# Patient Record
Sex: Male | Born: 1975 | Race: White | Hispanic: No | Marital: Married | Smoking: Current some day smoker
Health system: Southern US, Community
[De-identification: ages and names within clinical notes are randomized; demographics above are authoritative.]

## PROBLEM LIST (undated history)

## (undated) DIAGNOSIS — F1020 Alcohol dependence, uncomplicated: Secondary | ICD-10-CM

## (undated) DIAGNOSIS — T7840XA Allergy, unspecified, initial encounter: Secondary | ICD-10-CM

## (undated) HISTORY — DX: Alcohol dependence, uncomplicated: F10.20

## (undated) HISTORY — DX: Allergy, unspecified, initial encounter: T78.40XA

## (undated) HISTORY — PX: NO PAST SURGERIES: SHX2092

---

## 2007-12-09 ENCOUNTER — Encounter: Admission: RE | Admit: 2007-12-09 | Discharge: 2007-12-09 | Payer: Self-pay | Admitting: General Surgery

## 2008-04-12 ENCOUNTER — Encounter: Admission: RE | Admit: 2008-04-12 | Discharge: 2008-05-31 | Payer: Self-pay | Admitting: Sports Medicine

## 2017-03-05 ENCOUNTER — Encounter: Payer: Self-pay | Admitting: Medical

## 2017-03-05 ENCOUNTER — Ambulatory Visit (INDEPENDENT_AMBULATORY_CARE_PROVIDER_SITE_OTHER): Payer: BLUE CROSS/BLUE SHIELD | Admitting: Medical

## 2017-03-05 VITALS — BP 110/73 | HR 74 | Temp 98.2°F | Resp 16 | Ht 72.0 in | Wt 147.4 lb

## 2017-03-05 DIAGNOSIS — R109 Unspecified abdominal pain: Secondary | ICD-10-CM

## 2017-03-05 DIAGNOSIS — R112 Nausea with vomiting, unspecified: Secondary | ICD-10-CM | POA: Diagnosis not present

## 2017-03-05 DIAGNOSIS — F17200 Nicotine dependence, unspecified, uncomplicated: Secondary | ICD-10-CM | POA: Diagnosis not present

## 2017-03-05 MED ORDER — ONDANSETRON 8 MG PO TBDP: 8.0000 mg | ORAL_TABLET | Freq: Three times a day (TID) | ORAL | 0 refills | Status: DC | PRN

## 2017-03-05 MED ORDER — BUPROPION HCL ER (SR) 150 MG PO TB12: ORAL_TABLET | ORAL | 1 refills | Status: DC

## 2017-03-05 NOTE — Patient Instructions (Addendum)
For recent mild upset stomach with nausea and vomiting, I will get a CMP, CBC, amylase and lipase.  Recommend bland diet and hydrate well. Avoid any greasy foods and no alcohol.  For nausea prescribing Zofran.  I'm also prescribing Wellbutrin to help the stop smoking. Recommend waiting about 7-10 days beginning the medicine. Explained how can stop abruptly or taper off of cigarettes.  Follow-up in 2 weeks or as needed. If you want you to schedule complete physical.

## 2017-03-05 NOTE — Progress Notes (Signed)
Subjective:    Patient ID: Nadene Rubinsravis Viereck, male    DOB: [birth year], 41 y.o.   MRN: [id number]  HPI  Pt in for first time.   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 2 cups coffee a day. Admits a lot of processed food.(but does not eat out). Married- 3 children.  Pt states yesterday at work he got acute nausea and vomiting. He had to leave work. Pt had no diarrhea. Pt states ate subway but did not get upset his stomach. No family members sick. Pt feels some better today but energy is less. Stomach upset. Pt drank alcohol late last week. About 7 beers 2 nights. But none since Saturday. Pt has not eaten anything today.  Pt has been thinking of stopping smoking.     Review of Systems  Constitutional: Negative for chills, fatigue and fever.  HENT: Negative for congestion, drooling, ear pain, nosebleeds, postnasal drip, rhinorrhea, sinus pressure, sore throat and tinnitus.   Respiratory: Negative for cough, chest tightness, shortness of breath and wheezing.   Cardiovascular: Negative for chest pain and palpitations.  Gastrointestinal: Positive for abdominal pain, nausea and vomiting. Negative for abdominal distention, anal bleeding, blood in stool, constipation and diarrhea.       States pains upset stomach  Genitourinary: Negative for dysuria, flank pain and frequency.  Musculoskeletal: Negative for back pain, myalgias and neck pain.  Skin: Negative for rash.  Neurological: Negative for dizziness, seizures, light-headedness and headaches.  Hematological: Negative for adenopathy. Does not bruise/bleed easily.  Psychiatric/Behavioral: Negative for behavioral problems and confusion.    Past Medical History:  Diagnosis Date  . Allergy    seasonal.     Social History   Social History  . Marital status: Married    Spouse name: N/A  . Number of children: N/A  . Years of education: N/A   Occupational History  . Not on file.   Social History Main Topics  .  Smoking status: Current Some Day Smoker    Packs/day: 1.00    Years: 22.00  . Smokeless tobacco: Never Used     Comment: did counsel.  . Alcohol use Yes     Comment: 1-2 beers a nigh.  . Drug use: No  . Sexual activity: Yes   Other Topics Concern  . Not on file   Social History Narrative  . No narrative on file    History reviewed. No pertinent surgical history.  Family History  Problem Relation Age of Onset  . Cancer Mother     Allergies  Allergen Reactions  . Soma [Carisoprodol]     No current outpatient prescriptions on file prior to visit.   No current facility-administered medications on file prior to visit.     BP 110/73   Pulse 74   Temp 98.2 F (36.8 C) (Oral)   Resp 16   Ht 6' (1.829 m)   Wt 147 lb 6.4 oz (66.9 kg)   SpO2 99%   BMI 19.99 kg/m       Objective:   Physical Exam  General Mental Status- Alert. General Appearance- Not in acute distress.   Skin General: Color- Normal Color. Moisture- Normal Moisture.  Neck Carotid Arteries- Normal color. Moisture- Normal Moisture. No carotid bruits. No JVD.  Chest and Lung Exam Auscultation: Breath Sounds:-Normal.  Cardiovascular Auscultation:Rythm- Regular. Murmurs & Other Heart Sounds:Auscultation of the heart reveals- No Murmurs.  Abdomen Inspection:-Inspeection Normal. Palpation/Percussion:Note:No mass. Palpation and Percussion of the abdomen reveal- Non  Tender, Non Distended + BS, no rebound or guarding.    Neurologic Cranial Nerve exam:- CN III-XII intact(No nystagmus), symmetric smile. Strength:- 5/5 equal and symmetric strength both upper and lower extremities.      Assessment & Plan:  For recent mild upset stomach with nausea and vomiting, I will get a CMP, CBC, amylase and lipase.  Recommend bland diet and hydrate well. Avoid any greasy foods and no alcohol.  For nausea prescribing Zofran.  I'm also prescribing Wellbutrin to help the stop smoking. Recommend waiting  about 7-10 days beginning the medicine. Explained how can stop abruptly or taper off of cigarettes.  Follow-up in 2 weeks or as needed. If you want you to schedule complete physical.  Lavera Vandermeer, Ramon DredgeEdward, PA-C

## 2017-03-06 LAB — LIPASE: Lipase: 43 U/L (ref 11.0–59.0)

## 2017-03-06 LAB — CBC WITH DIFFERENTIAL/PLATELET
Basophils Absolute: 0.1 10*3/uL (ref 0.0–0.1)
Basophils Relative: 0.9 % (ref 0.0–3.0)
Eosinophils Absolute: 0.5 10*3/uL (ref 0.0–0.7)
Eosinophils Relative: 5.2 % — ABNORMAL HIGH (ref 0.0–5.0)
HCT: 46.9 % (ref 39.0–52.0)
Hemoglobin: 15.8 g/dL (ref 13.0–17.0)
Lymphocytes Relative: 25.2 % (ref 12.0–46.0)
Lymphs Abs: 2.5 10*3/uL (ref 0.7–4.0)
MCHC: 33.6 g/dL (ref 30.0–36.0)
MCV: 96 fl (ref 78.0–100.0)
Monocytes Absolute: 0.8 10*3/uL (ref 0.1–1.0)
Monocytes Relative: 8.4 % (ref 3.0–12.0)
Neutro Abs: 5.9 10*3/uL (ref 1.4–7.7)
Neutrophils Relative %: 60.3 % (ref 43.0–77.0)
Platelets: 300 10*3/uL (ref 150.0–400.0)
RBC: 4.88 Mil/uL (ref 4.22–5.81)
RDW: 13.5 % (ref 11.5–15.5)
WBC: 9.8 10*3/uL (ref 4.0–10.5)

## 2017-03-06 LAB — COMPREHENSIVE METABOLIC PANEL
ALT: 19 U/L (ref 0–53)
AST: 20 U/L (ref 0–37)
Albumin: 4.7 g/dL (ref 3.5–5.2)
Alkaline Phosphatase: 38 U/L — ABNORMAL LOW (ref 39–117)
BUN: 10 mg/dL (ref 6–23)
CO2: 31 mEq/L (ref 19–32)
Calcium: 9.5 mg/dL (ref 8.4–10.5)
Chloride: 103 mEq/L (ref 96–112)
Creatinine, Ser: 0.95 mg/dL (ref 0.40–1.50)
GFR: 92.24 mL/min (ref >60.00)
Glucose, Bld: 87 mg/dL (ref 70–99)
Potassium: 4.1 mEq/L (ref 3.5–5.1)
Sodium: 141 mEq/L (ref 135–145)
Total Bilirubin: 0.5 mg/dL (ref 0.2–1.2)
Total Protein: 7.2 g/dL (ref 6.0–8.3)

## 2017-03-06 LAB — AMYLASE: Amylase: 33 U/L (ref 27–131)

## 2017-03-09 ENCOUNTER — Telehealth: Payer: Self-pay | Admitting: Medical

## 2017-03-09 NOTE — Telephone Encounter (Signed)
Pt returned call for results.

## 2017-03-11 NOTE — Telephone Encounter (Signed)
Tried to reach pt again today. Mailed letter.

## 2017-03-18 ENCOUNTER — Ambulatory Visit (INDEPENDENT_AMBULATORY_CARE_PROVIDER_SITE_OTHER): Payer: BLUE CROSS/BLUE SHIELD | Admitting: Medical

## 2017-03-18 ENCOUNTER — Encounter: Payer: Self-pay | Admitting: Medical

## 2017-03-18 VITALS — BP 139/79 | HR 82 | Temp 98.2°F | Resp 16 | Ht 72.0 in | Wt 146.6 lb

## 2017-03-18 DIAGNOSIS — Z113 Encounter for screening for infections with a predominantly sexual mode of transmission: Secondary | ICD-10-CM | POA: Diagnosis not present

## 2017-03-18 DIAGNOSIS — Z0000 Encounter for general adult medical examination without abnormal findings: Secondary | ICD-10-CM | POA: Diagnosis not present

## 2017-03-18 DIAGNOSIS — Z23 Encounter for immunization: Secondary | ICD-10-CM | POA: Diagnosis not present

## 2017-03-18 LAB — COMPLETE METABOLIC PANEL WITH GFR
ALT: 17 U/L (ref 9–46)
AST: 20 U/L (ref 10–40)
Albumin: 4.6 g/dL (ref 3.6–5.1)
Alkaline Phosphatase: 44 U/L (ref 40–115)
BUN: 11 mg/dL (ref 7–25)
CO2: 23 mmol/L (ref 20–32)
Calcium: 9.9 mg/dL (ref 8.6–10.3)
Chloride: 103 mmol/L (ref 98–110)
Creat: 1 mg/dL (ref 0.60–1.35)
GFR, Est African American: >89 mL/min (ref >=60)
GFR, Est Non African American: >89 mL/min (ref >=60)
Glucose, Bld: 88 mg/dL (ref 65–99)
Potassium: 4.4 mmol/L (ref 3.5–5.3)
Sodium: 138 mmol/L (ref 135–146)
Total Bilirubin: 0.8 mg/dL (ref 0.2–1.2)
Total Protein: 6.9 g/dL (ref 6.1–8.1)

## 2017-03-18 LAB — POC URINALSYSI DIPSTICK (AUTOMATED)
Bilirubin, UA: NEGATIVE
Blood, UA: NEGATIVE
Glucose, UA: NEGATIVE
Ketones, UA: NEGATIVE
Leukocytes, UA: NEGATIVE
Nitrite, UA: NEGATIVE
Protein, UA: NEGATIVE
Spec Grav, UA: 1.02 (ref 1.010–1.025)
Urobilinogen, UA: 0.2 E.U./dL
pH, UA: 6 (ref 5.0–8.0)

## 2017-03-18 LAB — CBC WITH DIFFERENTIAL/PLATELET
Basophils Absolute: 0.1 10*3/uL (ref 0.0–0.1)
Basophils Relative: 1.3 % (ref 0.0–3.0)
Eosinophils Absolute: 0.3 10*3/uL (ref 0.0–0.7)
Eosinophils Relative: 4.4 % (ref 0.0–5.0)
HCT: 46.7 % (ref 39.0–52.0)
Hemoglobin: 15.7 g/dL (ref 13.0–17.0)
Lymphocytes Relative: 27.6 % (ref 12.0–46.0)
Lymphs Abs: 1.8 10*3/uL (ref 0.7–4.0)
MCHC: 33.7 g/dL (ref 30.0–36.0)
MCV: 95.5 fl (ref 78.0–100.0)
Monocytes Absolute: 0.5 10*3/uL (ref 0.1–1.0)
Monocytes Relative: 8.5 % (ref 3.0–12.0)
Neutro Abs: 3.8 10*3/uL (ref 1.4–7.7)
Neutrophils Relative %: 58.2 % (ref 43.0–77.0)
Platelets: 320 10*3/uL (ref 150.0–400.0)
RBC: 4.89 Mil/uL (ref 4.22–5.81)
RDW: 13 % (ref 11.5–15.5)
WBC: 6.4 10*3/uL (ref 4.0–10.5)

## 2017-03-18 LAB — LIPID PANEL
Cholesterol: 158 mg/dL (ref 0–200)
HDL: 63 mg/dL (ref >39.00)
LDL Cholesterol: 83 mg/dL (ref 0–99)
NonHDL: 95.44
Total CHOL/HDL Ratio: 3
Triglycerides: 61 mg/dL (ref 0.0–149.0)
VLDL: 12.2 mg/dL (ref 0.0–40.0)

## 2017-03-18 LAB — HIV ANTIBODY (ROUTINE TESTING W REFLEX): HIV 1&2 Ab, 4th Generation: NONREACTIVE

## 2017-03-18 NOTE — Progress Notes (Signed)
Subjective:    Patient ID: Ronald Berg, male    DOB: August 22, 1975, 41 y.o.   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 2 cups coffee a day. Admits a lot of processed food.(but does not eat out). Married- 3 children.  Pt states prior GI illness resolved. No diarrhea as before. Feels a lot better. Occasionally feels mild left lower quadrant region pain.  He is fasting.  Pt likely needs update tdap. Last one on record 1995.  Pt states he has been trying to stop smoking. I had written him Wellbutrin. Pt has tapered down some but he is still smoking some.     Review of Systems  Constitutional: Negative for chills, fatigue and fever.  HENT: Negative for congestion, ear discharge, facial swelling, hearing loss and postnasal drip.   Respiratory: Negative for cough, chest tightness, shortness of breath and wheezing.   Cardiovascular: Negative for palpitations.  Gastrointestinal: Negative for abdominal distention, abdominal pain, blood in stool, constipation, diarrhea, nausea and vomiting.       Most of symptoms resolved prior. Only faint transient mild in am. May last 10 minutes and then resolve.   Negative family history gi illness.    Genitourinary: Negative for decreased urine volume, difficulty urinating, enuresis, flank pain, frequency, penile pain, scrotal swelling and testicular pain.  Musculoskeletal: Negative for back pain and myalgias.  Skin: Negative for rash.  Neurological: Negative for dizziness, syncope, speech difficulty, weakness and headaches.  Hematological: Negative for adenopathy. Does not bruise/bleed easily.  Psychiatric/Behavioral: Negative for confusion, hallucinations, sleep disturbance and suicidal ideas. The patient is not nervous/anxious and is not hyperactive.    Past Medical History:  Diagnosis Date  . Allergy    seasonal.     Social History   Social History  . Marital status: Married    Spouse name: N/A    . Number of children: N/A  . Years of education: N/A   Occupational History  . Not on file.   Social History Main Topics  . Smoking status: Current Some Day Smoker    Packs/day: 1.00    Years: 22.00  . Smokeless tobacco: Never Used     Comment: did counsel.  . Alcohol use Yes     Comment: 1-2 beers a nigh.  . Drug use: No  . Sexual activity: Yes   Other Topics Concern  . Not on file   Social History Narrative  . No narrative on file    History reviewed. No pertinent surgical history.  Family History  Problem Relation Age of Onset  . Cancer Mother     Allergies  Allergen Reactions  . Soma [Carisoprodol]     Current Outpatient Prescriptions on File Prior to Visit  Medication Sig Dispense Refill  . buPROPion (WELLBUTRIN SR) 150 MG 12 hr tablet 1 tab po for 3 days then 1 tab po twice daily 63 tablet 1  . ondansetron (ZOFRAN ODT) 8 MG disintegrating tablet Take 1 tablet (8 mg total) by mouth every 8 (eight) hours as needed for nausea or vomiting. 20 tablet 0   No current facility-administered medications on file prior to visit.     BP 139/79   Pulse 82   Temp 98.2 F (36.8 C) (Oral)   Resp 16   Ht 6' (1.829 m)   Wt 146 lb 9.6 oz (66.5 kg)   SpO2 98%   BMI 19.88 kg/m       Objective:  Physical Exam   General  Mental Status - Alert. General Appearance - Well groomed. Not in acute distress.  Skin Rashes- No Rashes.  HEENT Head- Normal. Ear Auditory Canal - Left- Normal. Right - Normal.Tympanic Membrane- Left- Normal. Right- Normal. Eye Sclera/Conjunctiva- Left- Normal. Right- Normal. Nose & Sinuses Nasal Mucosa- Left-  Not  boggy and congested. Right-   Not boggy and  congested.Bilateral  No maxillary and no  frontal sinus pressure. Mouth & Throat Lips: Upper Lip- Normal: no dryness, cracking, pallor, cyanosis, or vesicular eruption. Lower Lip-Normal: no dryness, cracking, pallor, cyanosis or vesicular eruption. Buccal Mucosa- Bilateral- No  Aphthous ulcers. Oropharynx- No Discharge or Erythema. Tonsils: Characteristics- Bilateral- No Erythema or Congestion. Size/Enlargement- Bilateral- No enlargement. Discharge- bilateral-None.  Neck Neck- Supple. No Masses.   Chest and Lung Exam Auscultation: Breath Sounds:-Clear even and unlabored.  Cardiovascular Auscultation:Rythm- Regular, rate and rhythm. Murmurs & Other Heart Sounds:Ausculatation of the heart reveal- No Murmurs.  Lymphatic Head & Neck General Head & Neck Lymphatics: Bilateral: Description- No Localized lymphadenopathy.   Abdomen Inspection:-Inspection Normal.  Palpation/Perucssion: Palpation and Percussion of the abdomen reveal- Non Tender, No Rebound tenderness, No rigidity(Guarding) and No Palpable abdominal masses.  Liver:-Normal.  Spleen:- Normal.   Rectal Deferred.  Genital- deferred.  Neurologic Cranial Nerve exam:- CN III-XII intact(No nystagmus), symmetric smile. Strength:- 5/5 equal and symmetric strength both upper and lower extremities.      Assessment & Plan:  For you wellness exam today I have ordered cbc, cmp, lipid panel, ua and hiv.  Vaccine given today tdap.  Recommend exercise and healthy diet.  Continue smoking cessation.  We will let you know lab results as they come in.  Let me know if your mild transient left lower quadrant pain persists. The may be post GI illness related. But if worse, pesrists and does not taper off then my chart me. (in that event work up get hemoccult card and consider referral to GI. If indicated might consider flagyl and cipro. If symptoms worsen.  Follow up date appointment will be determined after lab review.   Flu vaccination can get in October as nurse visit.  Tacara Hadlock, Ramon Dredge, PA-C

## 2017-03-18 NOTE — Patient Instructions (Addendum)
For you wellness exam today I have ordered cbc, cmp, lipid panel, ua and hiv.  Vaccine given today tdap.  Recommend exercise and healthy diet.  Continue smoking cessation.  We will let you know lab results as they come in.  Let me know if your mild transient left lower quadrant pain persists. The may be post GI illness related. But if worse, pesrists and does not taper off then my chart me.(in that event work up get hemoccult card and consider referral to GI. If indicated might consider flagyl and cipro. If symptoms worsen.  Follow up date appointment will be determined after lab review.   Flu vaccination can get in October as nurse visit.    Preventive Care 40-64 Years, Male Preventive care refers to lifestyle choices and visits with your health care provider that can promote health and wellness. What does preventive care include?  A yearly physical exam. This is also called an annual well check.  Dental exams once or twice a year.  Routine eye exams. Ask your health care provider how often you should have your eyes checked.  Personal lifestyle choices, including: ? Daily care of your teeth and gums. ? Regular physical activity. ? Eating a healthy diet. ? Avoiding tobacco and drug use. ? Limiting alcohol use. ? Practicing safe sex. ? Taking low-dose aspirin every day starting at age 66. What happens during an annual well check? The services and screenings done by your health care provider during your annual well check will depend on your age, overall health, lifestyle risk factors, and family history of disease. Counseling Your health care provider may ask you questions about your:  Alcohol use.  Tobacco use.  Drug use.  Emotional well-being.  Home and relationship well-being.  Sexual activity.  Eating habits.  Work and work Statistician.  Screening You may have the following tests or measurements:  Height, weight, and BMI.  Blood pressure.  Lipid and  cholesterol levels. These may be checked every 5 years, or more frequently if you are over 51 years old.  Skin check.  Lung cancer screening. You may have this screening every year starting at age 49 if you have a 30-pack-year history of smoking and currently smoke or have quit within the past 15 years.  Fecal occult blood test (FOBT) of the stool. You may have this test every year starting at age 70.  Flexible sigmoidoscopy or colonoscopy. You may have a sigmoidoscopy every 5 years or a colonoscopy every 10 years starting at age 36.  Prostate cancer screening. Recommendations will vary depending on your family history and other risks.  Hepatitis C blood test.  Hepatitis B blood test.  Sexually transmitted disease (STD) testing.  Diabetes screening. This is done by checking your blood sugar (glucose) after you have not eaten for a while (fasting). You may have this done every 1-3 years.  Discuss your test results, treatment options, and if necessary, the need for more tests with your health care provider. Vaccines Your health care provider may recommend certain vaccines, such as:  Influenza vaccine. This is recommended every year.  Tetanus, diphtheria, and acellular pertussis (Tdap, Td) vaccine. You may need a Td booster every 10 years.  Varicella vaccine. You may need this if you have not been vaccinated.  Zoster vaccine. You may need this after age 29.  Measles, mumps, and rubella (MMR) vaccine. You may need at least one dose of MMR if you were born in 1957 or later. You may also need  a second dose.  Pneumococcal 13-valent conjugate (PCV13) vaccine. You may need this if you have certain conditions and have not been vaccinated.  Pneumococcal polysaccharide (PPSV23) vaccine. You may need one or two doses if you smoke cigarettes or if you have certain conditions.  Meningococcal vaccine. You may need this if you have certain conditions.  Hepatitis A vaccine. You may need this if  you have certain conditions or if you travel or work in places where you may be exposed to hepatitis A.  Hepatitis B vaccine. You may need this if you have certain conditions or if you travel or work in places where you may be exposed to hepatitis B.  Haemophilus influenzae type b (Hib) vaccine. You may need this if you have certain risk factors.  Talk to your health care provider about which screenings and vaccines you need and how often you need them. This information is not intended to replace advice given to you by your health care provider. Make sure you discuss any questions you have with your health care provider. Document Released: 08/24/2015 Document Revised: 04/16/2016 Document Reviewed: 05/29/2015 Elsevier Interactive Patient Education  2017 [employer].

## 2017-05-23 ENCOUNTER — Other Ambulatory Visit: Payer: Self-pay | Admitting: Medical

## 2017-11-04 ENCOUNTER — Encounter: Payer: Self-pay | Admitting: Medical

## 2017-11-04 ENCOUNTER — Ambulatory Visit (INDEPENDENT_AMBULATORY_CARE_PROVIDER_SITE_OTHER): Payer: BLUE CROSS/BLUE SHIELD | Admitting: Medical

## 2017-11-04 VITALS — BP 117/71 | HR 66 | Temp 98.0°F | Resp 16 | Ht 73.0 in | Wt 143.6 lb

## 2017-11-04 DIAGNOSIS — R1032 Left lower quadrant pain: Secondary | ICD-10-CM | POA: Diagnosis not present

## 2017-11-04 LAB — COMPREHENSIVE METABOLIC PANEL
ALT: 23 U/L (ref 0–53)
AST: 19 U/L (ref 0–37)
Albumin: 4.4 g/dL (ref 3.5–5.2)
Alkaline Phosphatase: 33 U/L — ABNORMAL LOW (ref 39–117)
BUN: 10 mg/dL (ref 6–23)
CO2: 30 mEq/L (ref 19–32)
Calcium: 9.7 mg/dL (ref 8.4–10.5)
Chloride: 104 mEq/L (ref 96–112)
Creatinine, Ser: 0.93 mg/dL (ref 0.40–1.50)
GFR: 94.24 mL/min (ref >60.00)
Glucose, Bld: 100 mg/dL — ABNORMAL HIGH (ref 70–99)
Potassium: 5.1 mEq/L (ref 3.5–5.1)
Sodium: 141 mEq/L (ref 135–145)
Total Bilirubin: 0.7 mg/dL (ref 0.2–1.2)
Total Protein: 6.9 g/dL (ref 6.0–8.3)

## 2017-11-04 LAB — CBC WITH DIFFERENTIAL/PLATELET
Basophils Absolute: 0 10*3/uL (ref 0.0–0.1)
Basophils Relative: 0.6 % (ref 0.0–3.0)
Eosinophils Absolute: 0.2 10*3/uL (ref 0.0–0.7)
Eosinophils Relative: 1.8 % (ref 0.0–5.0)
HCT: 45.3 % (ref 39.0–52.0)
Hemoglobin: 15.4 g/dL (ref 13.0–17.0)
Lymphocytes Relative: 24 % (ref 12.0–46.0)
Lymphs Abs: 2.1 10*3/uL (ref 0.7–4.0)
MCHC: 34.1 g/dL (ref 30.0–36.0)
MCV: 91.9 fl (ref 78.0–100.0)
Monocytes Absolute: 0.5 10*3/uL (ref 0.1–1.0)
Monocytes Relative: 5.8 % (ref 3.0–12.0)
Neutro Abs: 5.8 10*3/uL (ref 1.4–7.7)
Neutrophils Relative %: 67.8 % (ref 43.0–77.0)
Platelets: 284 10*3/uL (ref 150.0–400.0)
RBC: 4.93 Mil/uL (ref 4.22–5.81)
RDW: 13.3 % (ref 11.5–15.5)
WBC: 8.6 10*3/uL (ref 4.0–10.5)

## 2017-11-04 MED ORDER — METRONIDAZOLE 500 MG PO TABS: 500.0000 mg | ORAL_TABLET | Freq: Three times a day (TID) | ORAL | 0 refills | Status: DC

## 2017-11-04 MED ORDER — CIPROFLOXACIN HCL 500 MG PO TABS: 500.0000 mg | ORAL_TABLET | Freq: Two times a day (BID) | ORAL | 0 refills | Status: DC

## 2017-11-04 MED ORDER — ONDANSETRON 8 MG PO TBDP: 8.0000 mg | ORAL_TABLET | Freq: Three times a day (TID) | ORAL | 0 refills | Status: DC | PRN

## 2017-11-04 NOTE — Patient Instructions (Signed)
With your recent moderate to severe abdomen pain last night and left lower quadrant region pain presently, I am considering that you might have diverticulitis.  Your pain is on the lower and now but we need to proceed with caution.  Please get CBC, CMP and ifob stool cards.  Please do the stool card test and bring those back or mail back.  I want you to start Cipro and Flagyl antibiotics.  Eat healthy diet and stay hydrated.  If your pain does not gradually taper off then would consider getting CT of the abdomen/pelvis and possibly referring you to gastroenterologist as you have had some chronic pain over the past 4 months.  In the event of recurrent severe pain as described last night then recommend ED evaluation if that were to reoccur.  Also please update us if your location of abdomen pain changes.  Follow-up in 7 days or as needed.

## 2017-11-04 NOTE — Progress Notes (Signed)
Subjective:    Patient ID: Ronald Berg, male    DOB: July 17, 1976, 42 y.o.   MRN: [id number]  HPI  Pt in states had stomach pain on and off for a couple of years. Pt thought maybe alcohol related but he stopped drinking over past 4 months(he states pain still continues). Does drink about 4 cups of coffee. However stopped drinking coffee about one week.  Pt states last night woke up with sharp stabbing pain umbilicus toward left lower quadrant. He states pain was severe around 3:30 pm. He took zofran and within 10 minutes pain resolved. Pain is a lot less now. Pain level now is about 2/10.  Pt had little loose stools on and off. States regular basis has semi loose stools.  Over past 4 months he has been reporting some stomachs around mid stomach. Pain on and all day. More lower level.   Pt had no fevers, no chills and no sweats.  Pt did eat pizza last night. Store bought. No family members with same signs or symptoms.  No famiily history of crohns and ulcerative colitis.      Review of Systems  Constitutional: Negative for chills and fatigue.  Respiratory: Negative for cough, chest tightness, shortness of breath and wheezing.   Cardiovascular: Negative for chest pain and palpitations.  Gastrointestinal: Positive for abdominal pain and nausea. Negative for abdominal distention, blood in stool, diarrhea, rectal pain and vomiting.       Some dry heaves this morning but no vomiting.  Genitourinary: Negative for decreased urine volume, dysuria, frequency, genital sores and penile pain.  Musculoskeletal: Negative for back pain.  Skin: Negative for rash.  Neurological: Negative for facial asymmetry.  Hematological: Negative for adenopathy. Does not bruise/bleed easily.  Psychiatric/Behavioral: Negative for behavioral problems, confusion and self-injury.    Past Medical History:  Diagnosis Date  . Allergy    seasonal.     Social History   Socioeconomic History  . Marital  status: Married    Spouse name: Not on file  . Number of children: Not on file  . Years of education: Not on file  . Highest education level: Not on file  Occupational History  . Not on file  Social Needs  . Financial resource strain: Not on file  . Food insecurity:    Worry: Not on file    Inability: Not on file  . Transportation needs:    Medical: Not on file    Non-medical: Not on file  Tobacco Use  . Smoking status: Current Some Day Smoker    Packs/day: 1.00    Years: 22.00    Pack years: 22.00  . Smokeless tobacco: Never Used  . Tobacco comment: did counsel.  Substance and Sexual Activity  . Alcohol use: Yes    Comment: 1-2 beers a nigh.  . Drug use: No  . Sexual activity: Yes  Lifestyle  . Physical activity:    Days per week: Not on file    Minutes per session: Not on file  . Stress: Not on file  Relationships  . Social connections:    Talks on phone: Not on file    Gets together: Not on file    Attends religious service: Not on file    Active member of club or organization: Not on file    Attends meetings of clubs or organizations: Not on file    Relationship status: Not on file  . Intimate partner violence:    Fear of current or  ex partner: Not on file    Emotionally abused: Not on file    Physically abused: Not on file    Forced sexual activity: Not on file  Other Topics Concern  . Not on file  Social History Narrative  . Not on file    No past surgical history on file.  Family History  Problem Relation Age of Onset  . Cancer Mother     Allergies  Allergen Reactions  . Soma [Carisoprodol]     Current Outpatient Medications on File Prior to Visit  Medication Sig Dispense Refill  . buPROPion (WELLBUTRIN SR) 150 MG 12 hr tablet TAKE 1 TABLET BY MOUTH DAILY X 3 DAYS, THEN 1 TABLET TWICE DAILY 63 tablet 1  . ondansetron (ZOFRAN ODT) 8 MG disintegrating tablet Take 1 tablet (8 mg total) by mouth every 8 (eight) hours as needed for nausea or  vomiting. 20 tablet 0   No current facility-administered medications on file prior to visit.     BP 117/71   Pulse 66   Temp 98 F (36.7 C) (Oral)   Resp 16   Ht 6\' 1"  (1.854 m)   Wt 143 lb 9.6 oz (65.1 kg)   HC 6" (15.2 cm)   SpO2 100%   BMI 18.95 kg/m       Objective:   Physical Exam  General Appearance- Not in acute distress.  HEENT Eyes- Scleraeral/Conjuntiva-bilat- Not Yellow. Mouth & Throat- Normal.  Chest and Lung Exam Auscultation: Breath sounds:-Normal. Adventitious sounds:- No Adventitious sounds.  Cardiovascular Auscultation:Rythm - Regular. Heart Sounds -Normal heart sounds.  Abdomen Inspection:-Inspection Normal.  Palpation/Perucssion: Palpation and Percussion of the abdomen reveal-  Tender faint tenderness between umbilicus and llq. , Mild llq tender,  No Rebound tenderness, No rigidity(Guarding) and No Palpable abdominal masses.  Liver:-Normal.  Spleen:- Normal.   Genital exam- no obvious hernia on exam of either inguinal canal.(very high up on left side canal maybe defect but no obvious bulge).       Assessment & Plan:  With your recent moderate to severe abdomen pain last night and left lower quadrant region pain presently, I am considering that you might have diverticulitis.  Your pain is on the lower and now but we need to proceed with caution.  Please get CBC, CMP and ifob stool cards.  Please do the stool card test and bring those back or mail back.  I want you to start Cipro and Flagyl antibiotics.  Eat healthy diet and stay hydrated.  If your pain does not gradually taper off then would consider getting CT of the abdomen/pelvis and possibly referring you to gastroenterologist as you have had some chronic pain over the past 4 months.  In the event of recurrent severe pain as described last night then recommend ED evaluation if that were to reoccur.  Also please update [place] if your location of abdomen pain changes.  Follow-up in 7 days or  as needed.  Esperanza Richters, PA-C

## 2017-11-06 ENCOUNTER — Telehealth: Payer: Self-pay | Admitting: Medical

## 2017-11-06 NOTE — Telephone Encounter (Signed)
Copied from CRM [phone]. Topic: General - Other >> Nov 06, 2017  8:59 AM Maia Pettiesrtiz, Kristie S wrote: Reason for CRM: pt called stating he was stubborn in the office the other day and should have listened about being written out of work until Monday 11/09/17. He is requesting a new work note. Pt states ok to send note thru mychart.

## 2017-11-09 ENCOUNTER — Telehealth: Payer: Self-pay | Admitting: Medical

## 2017-11-09 NOTE — Telephone Encounter (Signed)
Would you clarify with patient when he wants to return to work.  Did he return to work today on the first.  I can write the note but I am not sure how to attach it to my chart.  Does he have a number that I could fax the letter to?  Let me know please

## 2017-11-09 NOTE — Telephone Encounter (Signed)
I tried to work note for patient last night.  Still cannot print work notes yet?  The note should read he can return to work on April 1.  I saw him last week.  If you could find that no and printed so I can sign it.  He wanted me to attach work note to my chart message.  I am not sure how to do that.  So if you could call him and get a fax number then we could fax the excuse.

## 2017-11-10 ENCOUNTER — Ambulatory Visit (INDEPENDENT_AMBULATORY_CARE_PROVIDER_SITE_OTHER): Payer: BLUE CROSS/BLUE SHIELD | Admitting: Medical

## 2017-11-10 ENCOUNTER — Ambulatory Visit (HOSPITAL_BASED_OUTPATIENT_CLINIC_OR_DEPARTMENT_OTHER)
Admission: RE | Admit: 2017-11-10 | Discharge: 2017-11-10 | Disposition: A | Discharge status: Home / Self Care | Payer: BLUE CROSS/BLUE SHIELD | Source: Ambulatory Visit | Attending: Medical | Admitting: Medical

## 2017-11-10 ENCOUNTER — Encounter: Payer: Self-pay | Admitting: Medical

## 2017-11-10 VITALS — BP 115/67 | HR 55 | Temp 97.9°F | Resp 16 | Ht 73.0 in | Wt 144.8 lb

## 2017-11-10 DIAGNOSIS — R1032 Left lower quadrant pain: Secondary | ICD-10-CM

## 2017-11-10 DIAGNOSIS — R112 Nausea with vomiting, unspecified: Secondary | ICD-10-CM | POA: Diagnosis not present

## 2017-11-10 DIAGNOSIS — K6389 Other specified diseases of intestine: Secondary | ICD-10-CM | POA: Insufficient documentation

## 2017-11-10 DIAGNOSIS — R195 Other fecal abnormalities: Secondary | ICD-10-CM | POA: Diagnosis not present

## 2017-11-10 LAB — COMPREHENSIVE METABOLIC PANEL
ALT: 32 U/L (ref 0–53)
AST: 27 U/L (ref 0–37)
Albumin: 4.3 g/dL (ref 3.5–5.2)
Alkaline Phosphatase: 33 U/L — ABNORMAL LOW (ref 39–117)
BUN: 11 mg/dL (ref 6–23)
CO2: 29 mEq/L (ref 19–32)
Calcium: 9.1 mg/dL (ref 8.4–10.5)
Chloride: 101 mEq/L (ref 96–112)
Creatinine, Ser: 0.94 mg/dL (ref 0.40–1.50)
GFR: 93.07 mL/min (ref >60.00)
Glucose, Bld: 90 mg/dL (ref 70–99)
Potassium: 4.2 mEq/L (ref 3.5–5.1)
Sodium: 136 mEq/L (ref 135–145)
Total Bilirubin: 0.8 mg/dL (ref 0.2–1.2)
Total Protein: 6.6 g/dL (ref 6.0–8.3)

## 2017-11-10 LAB — CBC WITH DIFFERENTIAL/PLATELET
Basophils Absolute: 0.1 10*3/uL (ref 0.0–0.1)
Basophils Relative: 0.6 % (ref 0.0–3.0)
Eosinophils Absolute: 0.2 10*3/uL (ref 0.0–0.7)
Eosinophils Relative: 2.2 % (ref 0.0–5.0)
HCT: 44.5 % (ref 39.0–52.0)
Hemoglobin: 15.1 g/dL (ref 13.0–17.0)
Lymphocytes Relative: 22.3 % (ref 12.0–46.0)
Lymphs Abs: 1.8 10*3/uL (ref 0.7–4.0)
MCHC: 34 g/dL (ref 30.0–36.0)
MCV: 90.9 fl (ref 78.0–100.0)
Monocytes Absolute: 0.6 10*3/uL (ref 0.1–1.0)
Monocytes Relative: 7.5 % (ref 3.0–12.0)
Neutro Abs: 5.4 10*3/uL (ref 1.4–7.7)
Neutrophils Relative %: 67.4 % (ref 43.0–77.0)
Platelets: 280 10*3/uL (ref 150.0–400.0)
RBC: 4.89 Mil/uL (ref 4.22–5.81)
RDW: 13.2 % (ref 11.5–15.5)
WBC: 8 10*3/uL (ref 4.0–10.5)

## 2017-11-10 LAB — AMYLASE: Amylase: 30 U/L (ref 27–131)

## 2017-11-10 LAB — LIPASE: Lipase: 18 U/L (ref 11.0–59.0)

## 2017-11-10 MED ORDER — IOPAMIDOL (ISOVUE-300) INJECTION 61%
100.0000 mL | Freq: Once | INTRAVENOUS | Status: AC | PRN
  Administered 2017-11-10: 100 mL via INTRAVENOUS

## 2017-11-10 NOTE — Telephone Encounter (Signed)
Pt has appointment today.  

## 2017-11-10 NOTE — Patient Instructions (Addendum)
For your left lower quadrant pain that is persisting, I want you to continue with antibiotics again for probable diverticulitis.  Since your  pain has recently worsened since last night, I do want to get a CT abdomen pelvis today.  Our staff is trying to get this test precertified.  Please go to the lab and do a CBC, CMP, amylase, lipase and H. pylori breath test.  Continue with Zofran for nausea or vomiting.   Work note/excuse written today.  If CT negative for diverticulitis then I do want you to return in stool panel kit.  Follow-up in 7-10 days or as needed.  Stool panel test ordered to turn in if diarrhea become predominant and ct negative.    

## 2017-11-10 NOTE — Telephone Encounter (Signed)
Found note and printed it.

## 2017-11-10 NOTE — Progress Notes (Signed)
Subjective:    Patient ID: Ronald Berg, male    DOB: Jun 18, 1976, 42 y.o.   MRN: [id number]  HPI    Pt has been having some nausea and vomiting recently. Pt states yesterday after smoking he had nausea and vomiting. He also states he ate salad last night before he vomiting.  Pt came in last week and he had left lower quadrant pain. Clinically I decided to treat with flagyl and cipro. Pt states pain in left lower quadrant was getting better but moderate pain this morning.  Pt has some fevers since I last saw him.  Chills and sweats last night.  Pt labs on last visit. Showed no elevated wbc and normal liver enzymes. Pt did mail the ifob cards on Saturday.  Pt states he felt good up until last night when he worsened again.    Review of Systems  Constitutional: Positive for chills, fatigue and fever.  Respiratory: Negative for cough, choking, shortness of breath and wheezing.   Cardiovascular: Negative for chest pain and palpitations.  Gastrointestinal: Positive for abdominal pain and diarrhea. Negative for abdominal distention, constipation, nausea and vomiting.       Pt is having some loose stools. Partly loose/partly formed.   Some have been mild watery at times but not consistent.  Musculoskeletal: Negative for back pain and myalgias.  Skin: Negative for rash.  Neurological: Negative for dizziness, numbness and headaches.  Hematological: Negative for adenopathy. Does not bruise/bleed easily.  Psychiatric/Behavioral: Negative for agitation and confusion.    Past Medical History:  Diagnosis Date  . Allergy    seasonal.     Social History   Socioeconomic History  . Marital status: Married    Spouse name: Not on file  . Number of children: Not on file  . Years of education: Not on file  . Highest education level: Not on file  Occupational History  . Not on file  Social Needs  . Financial resource strain: Not on file  . Food insecurity:    Worry: Not on file   Inability: Not on file  . Transportation needs:    Medical: Not on file    Non-medical: Not on file  Tobacco Use  . Smoking status: Current Some Day Smoker    Packs/day: 1.00    Years: 22.00    Pack years: 22.00  . Smokeless tobacco: Never Used  . Tobacco comment: did counsel.  Substance and Sexual Activity  . Alcohol use: Yes    Comment: 1-2 beers a nigh.  . Drug use: No  . Sexual activity: Yes  Lifestyle  . Physical activity:    Days per week: Not on file    Minutes per session: Not on file  . Stress: Not on file  Relationships  . Social connections:    Talks on phone: Not on file    Gets together: Not on file    Attends religious service: Not on file    Active member of club or organization: Not on file    Attends meetings of clubs or organizations: Not on file    Relationship status: Not on file  . Intimate partner violence:    Fear of current or ex partner: Not on file    Emotionally abused: Not on file    Physically abused: Not on file    Forced sexual activity: Not on file  Other Topics Concern  . Not on file  Social History Narrative  . Not on file  No past surgical history on file.  Family History  Problem Relation Age of Onset  . Cancer Mother     Allergies  Allergen Reactions  . Soma [Carisoprodol]     Current Outpatient Medications on File Prior to Visit  Medication Sig Dispense Refill  . buPROPion (WELLBUTRIN SR) 150 MG 12 hr tablet TAKE 1 TABLET BY MOUTH DAILY X 3 DAYS, THEN 1 TABLET TWICE DAILY 63 tablet 1  . ciprofloxacin (CIPRO) 500 MG tablet Take 1 tablet (500 mg total) by mouth 2 (two) times daily. 14 tablet 0  . metroNIDAZOLE (FLAGYL) 500 MG tablet Take 1 tablet (500 mg total) by mouth 3 (three) times daily. 21 tablet 0  . ondansetron (ZOFRAN ODT) 8 MG disintegrating tablet Take 1 tablet (8 mg total) by mouth every 8 (eight) hours as needed for nausea or vomiting. 20 tablet 0  . ondansetron (ZOFRAN ODT) 8 MG disintegrating tablet Take 1  tablet (8 mg total) by mouth every 8 (eight) hours as needed for nausea or vomiting. 20 tablet 0   No current facility-administered medications on file prior to visit.     BP 115/67   Pulse (!) 55   Temp 97.9 F (36.6 C) (Oral)   Resp 16   Ht 6' 1"  (1.854 m)   Wt 144 lb 12.8 oz (65.7 kg)   SpO2 100%   BMI 19.10 kg/m       Objective:   Physical Exam   General Mental Status- Alert. General Appearance- Not in acute distress.   Skin General: Color- Normal Color. Moisture- Normal Moisture.  Neck Carotid Arteries- Normal color. Moisture- Normal Moisture. No carotid bruits. No JVD.  Chest and Lung Exam Auscultation: Breath Sounds:-Normal.  Cardiovascular Auscultation:Rythm- Regular. Murmurs & Other Heart Sounds:Auscultation of the heart reveals- No Murmurs.  Abdomen Inspection:-Inspeection Normal. Palpation/Percussion:Note:No mass. Palpation and Percussion of the abdomen reveal- mid lower abdomen mild pain, umblical and left lower quadrant region pain, Non Distended + BS, no rebound or guarding.    Neurologic Cranial Nerve exam:- CN III-XII intact(No nystagmus), symmetric smile. Strength:- 5/5 equal and symmetric strength both upper and lower extremities.     Assessment & Plan:  For your left lower quadrant pain that is persisting, I want you to continue with antibiotics again for probable diverticulitis.  Since your  pain has recently worsened since last night, I do want to get a CT abdomen pelvis today.  Our staff is trying to get this test precertified.  Please go to the lab and do a CBC, CMP, amylase, lipase and H. pylori breath test.  Continue with Zofran for nausea or vomiting.   Work note/excuse written today.  If CT negative for diverticulitis then I do want you to return in stool panel kit.  Follow-up in 7-10 days or as needed.  Stool panel test ordered to turn in if diarrhea become predominant and ct negative.   Mackie Pai, PA-C

## 2017-11-11 ENCOUNTER — Encounter (HOSPITAL_BASED_OUTPATIENT_CLINIC_OR_DEPARTMENT_OTHER): Payer: Self-pay | Admitting: Emergency Medicine

## 2017-11-11 ENCOUNTER — Emergency Department (HOSPITAL_BASED_OUTPATIENT_CLINIC_OR_DEPARTMENT_OTHER)
Admission: EM | Admit: 2017-11-11 | Discharge: 2017-11-11 | Disposition: A | Discharge status: Home / Self Care | Payer: BLUE CROSS/BLUE SHIELD | Attending: Emergency Medicine | Admitting: Emergency Medicine

## 2017-11-11 ENCOUNTER — Ambulatory Visit: Payer: BLUE CROSS/BLUE SHIELD | Admitting: Medical

## 2017-11-11 ENCOUNTER — Encounter: Payer: Self-pay | Admitting: Medical

## 2017-11-11 ENCOUNTER — Other Ambulatory Visit: Payer: Self-pay

## 2017-11-11 ENCOUNTER — Emergency Department (HOSPITAL_BASED_OUTPATIENT_CLINIC_OR_DEPARTMENT_OTHER): Payer: BLUE CROSS/BLUE SHIELD

## 2017-11-11 ENCOUNTER — Ambulatory Visit (INDEPENDENT_AMBULATORY_CARE_PROVIDER_SITE_OTHER): Payer: BLUE CROSS/BLUE SHIELD | Admitting: Medical

## 2017-11-11 ENCOUNTER — Encounter: Payer: Self-pay | Admitting: Physician Assistant

## 2017-11-11 VITALS — BP 124/64 | HR 60 | Temp 98.0°F | Resp 16 | Ht 73.0 in | Wt 141.2 lb

## 2017-11-11 DIAGNOSIS — R1032 Left lower quadrant pain: Secondary | ICD-10-CM | POA: Diagnosis not present

## 2017-11-11 DIAGNOSIS — F17210 Nicotine dependence, cigarettes, uncomplicated: Secondary | ICD-10-CM | POA: Insufficient documentation

## 2017-11-11 DIAGNOSIS — R197 Diarrhea, unspecified: Secondary | ICD-10-CM | POA: Diagnosis not present

## 2017-11-11 DIAGNOSIS — Z79899 Other long term (current) drug therapy: Secondary | ICD-10-CM | POA: Diagnosis not present

## 2017-11-11 DIAGNOSIS — R1030 Lower abdominal pain, unspecified: Secondary | ICD-10-CM | POA: Diagnosis present

## 2017-11-11 DIAGNOSIS — R112 Nausea with vomiting, unspecified: Secondary | ICD-10-CM | POA: Diagnosis not present

## 2017-11-11 LAB — CBC WITH DIFFERENTIAL/PLATELET
Basophils Absolute: 0.1 10*3/uL (ref 0.0–0.1)
Basophils Relative: 1 %
Eosinophils Absolute: 0.3 10*3/uL (ref 0.0–0.7)
Eosinophils Relative: 3 %
HCT: 41.3 % (ref 39.0–52.0)
Hemoglobin: 14.5 g/dL (ref 13.0–17.0)
Lymphocytes Relative: 26 %
Lymphs Abs: 2.6 10*3/uL (ref 0.7–4.0)
MCH: 30.9 pg (ref 26.0–34.0)
MCHC: 35.1 g/dL (ref 30.0–36.0)
MCV: 88.1 fL (ref 78.0–100.0)
Monocytes Absolute: 1 10*3/uL (ref 0.1–1.0)
Monocytes Relative: 10 %
Neutro Abs: 6 10*3/uL (ref 1.7–7.7)
Neutrophils Relative %: 60 %
Platelets: 278 10*3/uL (ref 150–400)
RBC: 4.69 MIL/uL (ref 4.22–5.81)
RDW: 12.6 % (ref 11.5–15.5)
WBC: 9.8 10*3/uL (ref 4.0–10.5)

## 2017-11-11 LAB — COMPREHENSIVE METABOLIC PANEL
ALT: 35 U/L (ref 17–63)
AST: 34 U/L (ref 15–41)
Albumin: 4.3 g/dL (ref 3.5–5.0)
Alkaline Phosphatase: 30 U/L — ABNORMAL LOW (ref 38–126)
Anion gap: 11 (ref 5–15)
BUN: 9 mg/dL (ref 6–20)
CO2: 21 mmol/L — ABNORMAL LOW (ref 22–32)
Calcium: 8.7 mg/dL — ABNORMAL LOW (ref 8.9–10.3)
Chloride: 102 mmol/L (ref 101–111)
Creatinine, Ser: 0.94 mg/dL (ref 0.61–1.24)
GFR calc Af Amer: >60 mL/min (ref >60)
GFR calc non Af Amer: >60 mL/min (ref >60)
Glucose, Bld: 79 mg/dL (ref 65–99)
Potassium: 3.5 mmol/L (ref 3.5–5.1)
Sodium: 134 mmol/L — ABNORMAL LOW (ref 135–145)
Total Bilirubin: 0.9 mg/dL (ref 0.3–1.2)
Total Protein: 6.8 g/dL (ref 6.5–8.1)

## 2017-11-11 LAB — URINALYSIS, ROUTINE W REFLEX MICROSCOPIC
Bilirubin Urine: NEGATIVE
Glucose, UA: NEGATIVE mg/dL
Hgb urine dipstick: NEGATIVE
Ketones, ur: 15 mg/dL — AB
Leukocytes, UA: NEGATIVE
Nitrite: NEGATIVE
Protein, ur: NEGATIVE mg/dL
Specific Gravity, Urine: <1.005 — ABNORMAL LOW (ref 1.005–1.030)
pH: 7 (ref 5.0–8.0)

## 2017-11-11 LAB — LIPASE, BLOOD: Lipase: 30 U/L (ref 11–51)

## 2017-11-11 LAB — H. PYLORI BREATH TEST: H. pylori Breath Test: NOT DETECTED

## 2017-11-11 MED ORDER — METOCLOPRAMIDE HCL 5 MG/ML IJ SOLN
10.0000 mg | Freq: Once | INTRAMUSCULAR | Status: AC
  Administered 2017-11-11: 10 mg via INTRAVENOUS
  Filled 2017-11-11: qty 2

## 2017-11-11 MED ORDER — LOPERAMIDE HCL 2 MG PO CAPS
4.0000 mg | ORAL_CAPSULE | Freq: Once | ORAL | Status: AC
  Administered 2017-11-11: 4 mg via ORAL
  Filled 2017-11-11: qty 2

## 2017-11-11 MED ORDER — LOPERAMIDE HCL 2 MG PO CAPS: 2.0000 mg | ORAL_CAPSULE | Freq: Four times a day (QID) | ORAL | 0 refills | Status: DC | PRN

## 2017-11-11 MED ORDER — SODIUM CHLORIDE 0.9 % IV BOLUS
1000.0000 mL | Freq: Once | INTRAVENOUS | Status: AC
  Administered 2017-11-11: 1000 mL via INTRAVENOUS

## 2017-11-11 MED ORDER — METOCLOPRAMIDE HCL 10 MG PO TABS: 10.0000 mg | ORAL_TABLET | Freq: Four times a day (QID) | ORAL | 0 refills | Status: DC

## 2017-11-11 NOTE — Progress Notes (Signed)
Subjective:    Patient ID: Ronald Berg, male    DOB: [birth year], 42 y.o.   MRN: [id number]  HPI  Pt in for follow up.   He still has some severe left lower quadrant pain. Pt states zofran helps with nauseu but it is not adequate. Pt tried to eat 2 pieces of toast but could not significant food or fluid intake.  Pt did talk with GI office and appointment is December 01, 2017.   Pt feels weak. Wife states he looks like he is about to pass out ealier.  Patient admits that he is very tired.  CT summary showed yesterday Distal colon is incompletely a distended, with suboptimal assessment of colonic wall thickness; however no definite signs of acute diverticulitis or colonic inflammation are identified.  Probable tiny hiatal hernia.  Remainder of exam unremarkable.    Pt had large loose stools after constrast.   But this morning also loose stool but not as much.  Pt is approaching end of initial antibiotic course.   Pain level is 5/10. But last Wednesday was worse.    Review of Systems  Constitutional: Positive for fatigue. Negative for chills.  Respiratory: Negative for cough, chest tightness, shortness of breath and wheezing.   Cardiovascular: Negative for chest pain and palpitations.  Gastrointestinal: Positive for abdominal pain, diarrhea and nausea. Negative for abdominal distention, blood in stool and vomiting.       Dry heaving.  Genitourinary: Negative for decreased urine volume, difficulty urinating, discharge, frequency, scrotal swelling and testicular pain.  Musculoskeletal: Negative for back pain.  Skin: Negative for rash.  Neurological: Negative for dizziness, speech difficulty, weakness, light-headedness and numbness.  Hematological: Negative for adenopathy. Does not bruise/bleed easily.  Psychiatric/Behavioral: Negative for behavioral problems, decreased concentration, dysphoric mood and suicidal ideas. The patient is not nervous/anxious.     Past  Medical History:  Diagnosis Date  . Allergy    seasonal.     Social History   Socioeconomic History  . Marital status: Married    Spouse name: Not on file  . Number of children: Not on file  . Years of education: Not on file  . Highest education level: Not on file  Occupational History  . Not on file  Social Needs  . Financial resource strain: Not on file  . Food insecurity:    Worry: Not on file    Inability: Not on file  . Transportation needs:    Medical: Not on file    Non-medical: Not on file  Tobacco Use  . Smoking status: Current Some Day Smoker    Packs/day: 1.00    Years: 22.00    Pack years: 22.00  . Smokeless tobacco: Never Used  . Tobacco comment: did counsel.  Substance and Sexual Activity  . Alcohol use: Yes    Comment: 1-2 beers a nigh.  . Drug use: No  . Sexual activity: Yes  Lifestyle  . Physical activity:    Days per week: Not on file    Minutes per session: Not on file  . Stress: Not on file  Relationships  . Social connections:    Talks on phone: Not on file    Gets together: Not on file    Attends religious service: Not on file    Active member of club or organization: Not on file    Attends meetings of clubs or organizations: Not on file    Relationship status: Not on file  . Intimate partner violence:  Fear of current or ex partner: Not on file    Emotionally abused: Not on file    Physically abused: Not on file    Forced sexual activity: Not on file  Other Topics Concern  . Not on file  Social History Narrative  . Not on file    No past surgical history on file.  Family History  Problem Relation Age of Onset  . Cancer Mother     Allergies  Allergen Reactions  . Soma [Carisoprodol]     Current Outpatient Medications on File Prior to Visit  Medication Sig Dispense Refill  . buPROPion (WELLBUTRIN SR) 150 MG 12 hr tablet TAKE 1 TABLET BY MOUTH DAILY X 3 DAYS, THEN 1 TABLET TWICE DAILY 63 tablet 1  . ciprofloxacin (CIPRO)  500 MG tablet Take 1 tablet (500 mg total) by mouth 2 (two) times daily. 14 tablet 0  . metroNIDAZOLE (FLAGYL) 500 MG tablet Take 1 tablet (500 mg total) by mouth 3 (three) times daily. 21 tablet 0  . ondansetron (ZOFRAN ODT) 8 MG disintegrating tablet Take 1 tablet (8 mg total) by mouth every 8 (eight) hours as needed for nausea or vomiting. 20 tablet 0  . ondansetron (ZOFRAN ODT) 8 MG disintegrating tablet Take 1 tablet (8 mg total) by mouth every 8 (eight) hours as needed for nausea or vomiting. 20 tablet 0   No current facility-administered medications on file prior to visit.     BP 124/64   Pulse 60   Temp 98 F (36.7 C) (Oral)   Resp 16   Ht 6\' 1"  (1.854 m)   Wt 141 lb 3.2 oz (64 kg)   SpO2 100%   BMI 18.63 kg/m       Objective:   Physical Exam  General Appearance- Not in acute distress.  But does appeared fatigued/weak.  HEENT Eyes- Scleraeral/Conjuntiva-bilat- Not Yellow. Mouth & Throat- Normal.  Chest and Lung Exam Auscultation: Breath sounds:-Normal. Adventitious sounds:- No Adventitious sounds.  Cardiovascular Auscultation:Rythm - Regular. Heart Sounds -Normal heart sounds.  Abdomen Inspection:-Inspection Normal.  Palpation/Perucssion: Palpation and Percussion of the abdomen reveal-moderate supra pubic region tenderness and left lower quadrant tenderness, No Rebound tenderness, No rigidity(Guarding) and No Palpable abdominal masses.  Liver:-Normal.  Spleen:- Normal.   Back- no obvious CVA tenderness.      Assessment & Plan:  For your moderate to severe left lower quadrant pain, I want you to continue with oral antibiotics.  Since you are unable to tolerate food or liquids well presently and feels very weak, I do think it would be a good idea for you to go downstairs and get evaluated.  They would likely give you some IV hydration.  I notify them of your recent clinical presentation and suboptimal CT scan.  I did get your appointment with GI moved to  tomorrow at 10 AM.  Your appointment is with Gunnar Fusi.  Follow-up here after ED evaluation and GI appointment.

## 2017-11-11 NOTE — ED Notes (Signed)
Pt sipping water for fluid challenge.

## 2017-11-11 NOTE — ED Triage Notes (Signed)
LLQ abd pain with vomiting x 1 week. Started on abx by his PCP. Pt states symptoms persist.

## 2017-11-11 NOTE — Discharge Instructions (Addendum)
Please keep your appointment with gastroenterology tomorrow.   Take one tablet of loperamide every 6 hours as needed for diarrhea. Your first dose was given in the emergency department. Take one tablet of Reglan every 6 hours for nausea or vomiting. Stop taking the Zofran. Continue taking metronidazole and ciprofloxacin as prescribed.  If you feel like eating, following a bland diet (food choices are attached) may help to improve your symptoms.  It may take several days for the stool sample that was collected tonight to result in the lab. Your primary care of GI can follow up on these results with you.  If you develop new or worsening symptoms, including vomiting despite taking Reglan, blood in your diarrhea or vomit, uncontrollable abdominal pain, or if you stop having bowel movements or passing gas, please return to the Emergency Department for re-evaluation.

## 2017-11-11 NOTE — ED Provider Notes (Signed)
[facility] EMERGENCY DEPARTMENT Provider Note   CSN: [id number] Arrival date & time: 11/11/17  1557     History   Chief Complaint Chief Complaint  Patient presents with  . Abdominal Pain    HPI Ronald Berg is a 42 y.o. male who presents to the emergency department from his PCPs office with a chief complaint of abdominal pain, nausea, vomiting, and diarrhea.  Patient endorses bilateral lower abdominal pain, characterized as sharp, that began suddenly in the middle of the night 1 week ago.  Patient states the pain awoke him from sleep.  Since the onset of pain, pain has been intermittent, and is now located more locally in the suprapubic and left lower quadrant.  He now characterizes the pain as twisting.  He reports shortly after the onset of pain he developed chills, nausea, vomiting, and diarrhea.  He reports multiple episodes of NBNB vomiting and non-bloody diarrhea daily since onset of symptoms.  He also endorses anorexia.  His wife reports that he had a tactile fever 2 days ago, but they have not measured his temperature.  He also reports that he is now having bilateral lower back pain that radiates up to his mid back.  Last episode of emesis was last night, but he has had multiple episodes of dry heaving today.  He has not eaten today; last meal was yesterday afternoon when he had a salad.  No recent travel outside of the country or camping.  No notes sick contacts.  He was seen by his PCP just prior to arrival in the ED and on 11/04/2017 after symptoms began.  CT scan yesterday was inconclusive.  He was started on metronidazole, Flagyl, and Zofran with no improvement in his symptoms on 3/27.  No history of abdominal surgery.  His wife reports that he used to be a rather heavy drinker, but has not had any alcohol intake since December 2018.  He reports that he was a daily marijuana smoker up until the onset of symptoms.  He has an appointment with GI tomorrow.   The  history is provided by the patient. No language interpreter was used.  Abdominal Pain   Associated symptoms include diarrhea, nausea and vomiting. Pertinent negatives include fever, constipation, dysuria, hematuria and headaches.    Past Medical History:  Diagnosis Date  . Allergy    seasonal.    There are no active problems to display for this patient.   History reviewed. No pertinent surgical history.     Home Medications    Prior to Admission medications   Medication Sig Start Date End Date Taking? Authorizing Provider  buPROPion (WELLBUTRIN SR) 150 MG 12 hr tablet TAKE 1 TABLET BY MOUTH DAILY X 3 DAYS, THEN 1 TABLET TWICE DAILY 05/25/17   Saguier, Ramon Dredge, PA-C  ciprofloxacin (CIPRO) 500 MG tablet Take 1 tablet (500 mg total) by mouth 2 (two) times daily. 11/04/17   Saguier, Ramon Dredge, PA-C  loperamide (IMODIUM) 2 MG capsule Take 1 capsule (2 mg total) by mouth 4 (four) times daily as needed for diarrhea or loose stools. 11/11/17   McDonald, Mia A, PA-C  metoCLOPramide (REGLAN) 10 MG tablet Take 1 tablet (10 mg total) by mouth every 6 (six) hours. 11/11/17   McDonald, Mia A, PA-C  metroNIDAZOLE (FLAGYL) 500 MG tablet Take 1 tablet (500 mg total) by mouth 3 (three) times daily. 11/04/17   Saguier, Ramon Dredge, PA-C  ondansetron (ZOFRAN ODT) 8 MG disintegrating tablet Take 1 tablet (8 mg total) by mouth  every 8 (eight) hours as needed for nausea or vomiting. 03/05/17   Saguier, Ramon DredgeEdward, PA-C  ondansetron (ZOFRAN ODT) 8 MG disintegrating tablet Take 1 tablet (8 mg total) by mouth every 8 (eight) hours as needed for nausea or vomiting. 11/04/17   Saguier, Ramon DredgeEdward, PA-C    Family History Family History  Problem Relation Age of Onset  . Cancer Mother     Social History Social History   Tobacco Use  . Smoking status: Current Some Day Smoker    Packs/day: 1.00    Years: 22.00    Pack years: 22.00  . Smokeless tobacco: Never Used  . Tobacco comment: did counsel.  Substance Use Topics  .  Alcohol use: Yes    Comment: 1-2 beers a nigh.  . Drug use: No     Allergies   Soma [carisoprodol]   Review of Systems Review of Systems  Constitutional: Negative for appetite change and fever.  HENT: Negative for congestion and sinus pain.   Respiratory: Negative for shortness of breath.   Cardiovascular: Negative for chest pain.  Gastrointestinal: Positive for abdominal pain, diarrhea, nausea and vomiting. Negative for abdominal distention, anal bleeding, blood in stool, constipation and rectal pain.  Genitourinary: Negative for dysuria, hematuria, penile pain and penile swelling.  Musculoskeletal: Negative for back pain, neck pain and neck stiffness.  Skin: Negative for rash.  Allergic/Immunologic: Negative for immunocompromised state.  Neurological: Negative for dizziness, weakness, numbness and headaches.  Psychiatric/Behavioral: Negative for confusion.   Physical Exam Updated Vital Signs BP 123/60 (BP Location: Right Arm)   Pulse 62   Temp 98.3 F (36.8 C) (Oral)   Resp 20   SpO2 94%   Physical Exam  Constitutional: He appears well-developed. No distress.  Well appearing. Non-toxic.   HENT:  Head: Normocephalic.  Eyes: Conjunctivae are normal.  Neck: Neck supple.  Cardiovascular: Normal rate, regular rhythm, normal heart sounds and intact distal pulses. Exam reveals no gallop and no friction rub.  No murmur heard. Pulmonary/Chest: Effort normal and breath sounds normal. No stridor. No respiratory distress. He has no wheezes. He has no rales. He exhibits no tenderness.  Abdominal: Soft. Bowel sounds are normal. He exhibits no distension and no mass. There is tenderness. There is no rebound and no guarding. No hernia.  Minimally tender to palpation in the left lower quadrant.  No rebound or guarding.  The remainder of the abdominal exam is unremarkable.  No peritoneal signs.  No CVA tenderness bilaterally.  Musculoskeletal: Normal range of motion. He exhibits no  edema, tenderness or deformity.  Neurological: He is alert.  Skin: Skin is warm and dry. Capillary refill takes less than 2 seconds. No rash noted. He is not diaphoretic. No pallor.  Psychiatric: His behavior is normal.  Nursing note and vitals reviewed.  ED Treatments / Results  Labs (all labs ordered are listed, but only abnormal results are displayed) Labs Reviewed  URINALYSIS, ROUTINE W REFLEX MICROSCOPIC - Abnormal; Notable for the following components:      Result Value   Specific Gravity, Urine <1.005 (*)    Ketones, ur 15 (*)    All other components within normal limits  COMPREHENSIVE METABOLIC PANEL - Abnormal; Notable for the following components:   Sodium 134 (*)    CO2 21 (*)    Calcium 8.7 (*)    Alkaline Phosphatase 30 (*)    All other components within normal limits  GASTROINTESTINAL PANEL BY PCR, STOOL (REPLACES STOOL CULTURE)  CBC WITH DIFFERENTIAL/PLATELET  LIPASE, BLOOD    EKG EKG Interpretation  Date/Time:  Wednesday November 11 2017 17:11:47 EDT Ventricular Rate:  50 PR Interval:    QRS Duration: 112 QT Interval:  391 QTC Calculation: 357 R Axis:   89 Text Interpretation:  Sinus rhythm Borderline intraventricular conduction delay ST elev, probable normal early repol pattern No old tracing to compare Confirmed by Melene Plan [phone]) on 11/11/2017 5:19:45 PM   Radiology Ct Abdomen Pelvis Wo Contrast  Result Date: 11/11/2017 CLINICAL DATA:  42 y/o M; 1 week of sharp abdominal pain, nausea, and vomiting. EXAM: CT ABDOMEN AND PELVIS WITHOUT CONTRAST TECHNIQUE: Multidetector CT imaging of the abdomen and pelvis was performed following the standard protocol without IV contrast. COMPARISON:  11/10/2017 CT abdomen and pelvis. FINDINGS: Lower chest: No acute abnormality. Hepatobiliary: Stable subcentimeter cyst in right lobe of liver. Otherwise no focal liver abnormality is seen. No gallstones, gallbladder wall thickening, or biliary dilatation. Pancreas: Unremarkable.  No pancreatic ductal dilatation or surrounding inflammatory changes. Spleen: Normal in size without focal abnormality. Adrenals/Urinary Tract: Adrenal glands are unremarkable. Kidneys are normal, without renal calculi, focal lesion, or hydronephrosis. Bladder is unremarkable. Stomach/Bowel: Stomach is within normal limits. Appendix not identified, no pericecal inflammation. No evidence of bowel wall thickening, distention, or inflammatory changes. Vascular/Lymphatic: No significant vascular findings are present. No enlarged abdominal or pelvic lymph nodes. Reproductive: Prostate is unremarkable. Other: No abdominal wall hernia or abnormality. No abdominopelvic ascites. Musculoskeletal: No fracture is seen. IMPRESSION: No acute process identified. Stable negative CT of the abdomen and pelvis. Electronically Signed   By: Mitzi Hansen M.D.   On: 11/11/2017 19:00   Ct Abdomen Pelvis W Contrast  Result Date: 11/10/2017 CLINICAL DATA:  Abdominal pain, LEFT lower quadrant pain and tenderness for over week, nausea and vomiting for several days, question diverticulitis EXAM: CT ABDOMEN AND PELVIS WITH CONTRAST TECHNIQUE: Multidetector CT imaging of the abdomen and pelvis was performed using the standard protocol following bolus administration of intravenous contrast. Sagittal and coronal MPR images reconstructed from axial data set. CONTRAST:  ISOVUE-300 IOPAMIDOL (ISOVUE-300) INJECTION 61% IV. Dilute oral contrast. COMPARISON:  12/09/2007 FINDINGS: Lower chest: Lung bases appear minimally hyperinflated but clear Hepatobiliary: Tiny probable cyst RIGHT lobe liver 5 mm diameter. Gallbladder and liver otherwise normal appearance. Pancreas: Normal appearance Spleen: Normal appearance.  Small splenule at splenic hilum. Adrenals/Urinary Tract: Adrenal glands, kidneys, ureters, and bladder normal appearance. Stomach/Bowel: Normal appendix. Stomach decompressed with suspect tiny hiatal hernia. Small bowel loops  unremarkable. Oral contrast is present to the rectum. Rectosigmoid colon is incompletely distended, wall slightly prominent though inconsistent and this may be an artifact related to underdistention. No definite colonic inflammatory process is identified. No colonic diverticula noted. Vascular/Lymphatic: Vascular structures unremarkable. Aorta normal caliber. No adenopathy. Reproductive: Minimal prostatic enlargement. Other: No free air or free fluid. No hernia or acute inflammatory process seen. Musculoskeletal: Normal appearance IMPRESSION: Distal colon is incompletely a distended, with suboptimal assessment of colonic wall thickness; however no definite signs of acute diverticulitis or colonic inflammation are identified. Probable tiny hiatal hernia. Remainder of exam unremarkable. Electronically Signed   By: Ulyses Southward M.D.   On: 11/10/2017 12:36    Procedures Procedures (including critical care time)  Medications Ordered in ED Medications  metoCLOPramide (REGLAN) injection 10 mg (10 mg Intravenous Given 11/11/17 1737)  sodium chloride 0.9 % bolus 1,000 mL (0 mLs Intravenous Stopped 11/11/17 1924)  loperamide (IMODIUM) capsule 4 mg (4 mg Oral Given 11/11/17 1739)  Initial Impression / Assessment and Plan / ED Course  I have reviewed the triage vital signs and the nursing notes.  Pertinent labs & imaging results that were available during my care of the patient were reviewed by me and considered in my medical decision making (see chart for details).     42 year old male with abdominal pain, nausea, vomiting, and diarrhea.  CT scan yesterday demonstrated incomplete distention of the distal colon with suboptimal assessment of colonic wall thickness, but no definite signs of acute radiculitis or colitis.  He has been taking ciprofloxacin and metronidazole prescribed by his PCP since 11/04/2017.  Labs today are reassuring.  EKG with normal QTC despite recent antiemetic use.  Repeat CT with no acute  intra-abdominal pathology.  Reglan given for nausea.  He was given an IV fluid bolus.  He was successfully fluid challenged in the ED. Patient is nontoxic, nonseptic appearing, in no apparent distress.  Patient does not meet the SIRS or Sepsis criteria.  On repeat exam patient does not have a surgical abdomen and there are no peritoneal signs.  No indication of appendicitis, bowel obstruction, bowel perforation, cholecystitis, diverticulitis.  Patient discharged home with Reglan, loperamide, and given strict instructions for follow-up with their primary care physician.  Recommended continuation of home metronidazole and ciprofloxacin until the course is finished.  I have also discussed reasons to return immediately to the ER.  Patient expresses understanding and agrees with plan.  Final Clinical Impressions(s) / ED Diagnoses   Final diagnoses:  Nausea vomiting and diarrhea  Left lower quadrant pain    ED Discharge Orders        Ordered    loperamide (IMODIUM) 2 MG capsule  4 times daily PRN     11/11/17 1945    metoCLOPramide (REGLAN) 10 MG tablet  Every 6 hours     11/11/17 1945       McDonald, Mia A, PA-C 11/12/17 0013    Melene Plan, DO 11/12/17 1605

## 2017-11-11 NOTE — Patient Instructions (Addendum)
For your moderate to severe left lower quadrant pain, I want you to continue with oral antibiotics.  Since you are unable to tolerate food or liquids well presently and feels very weak, I do think it would be a good idea for you to go downstairs and get evaluated.  They would likely give you  IV hydration.  I notify them of your recent clinical presentation and suboptimal CT scan.  I did get your appointment with GI moved to tomorrow at 10 AM.  Your appointment is with Gunnar FusiPaula.  Follow-up here after ED evaluation and GI appointment.

## 2017-11-12 ENCOUNTER — Encounter: Payer: Self-pay | Admitting: Nurse Practitioner

## 2017-11-12 ENCOUNTER — Other Ambulatory Visit (INDEPENDENT_AMBULATORY_CARE_PROVIDER_SITE_OTHER): Payer: BLUE CROSS/BLUE SHIELD

## 2017-11-12 ENCOUNTER — Ambulatory Visit (INDEPENDENT_AMBULATORY_CARE_PROVIDER_SITE_OTHER): Payer: BLUE CROSS/BLUE SHIELD | Admitting: Nurse Practitioner

## 2017-11-12 VITALS — BP 108/70 | HR 64 | Ht 70.75 in | Wt 139.5 lb

## 2017-11-12 DIAGNOSIS — R197 Diarrhea, unspecified: Secondary | ICD-10-CM

## 2017-11-12 DIAGNOSIS — R112 Nausea with vomiting, unspecified: Secondary | ICD-10-CM

## 2017-11-12 DIAGNOSIS — R1032 Left lower quadrant pain: Secondary | ICD-10-CM | POA: Diagnosis not present

## 2017-11-12 LAB — GASTROINTESTINAL PANEL BY PCR, STOOL (REPLACES STOOL CULTURE)

## 2017-11-12 LAB — FECAL OCCULT BLOOD, IMMUNOCHEMICAL: Fecal Occult Bld: NEGATIVE

## 2017-11-12 MED ORDER — NA SULFATE-K SULFATE-MG SULF 17.5-3.13-1.6 GM/177ML PO SOLN: 1.0000 | Freq: Once | ORAL | 0 refills | Status: AC

## 2017-11-12 NOTE — Progress Notes (Signed)
Assessment and plan reviewed 

## 2017-11-12 NOTE — Progress Notes (Signed)
Chief Complaint: LLQ pain and diarrhea  Referring Provider:   Brayton Caves, PA-C   ASSESSMENT AND PLAN;   42 year old male with acute onset of left lower quadrant pain, nausea, vomiting, and diarrhea.  Labs and two CT scans basically unremarkable the rectosigmoid colon under distended on the first CT scan .  Patient had similar symptoms in July without diarrhea.  Episodes resolve spontaneously.  He is currently on antibiotics prescribed by PCP for presumed diverticulitis .  Today patient feels fine with no GI symptoms .  -Await stool studies collected in ED. recently stopped drinking alcohol, could be contributing to the diarrhea -Complete antibiotics prescribed by PCP -Patient should have a colonoscopic evaluation not so much for these acute symptoms but rather for his history of minor rectal bleeding.  The risks and benefits of colonoscopy with possible polypectomy and biopsies were discussed and the patient agrees to proceed.    HPI:    Patient is a 42 year old male referred by PCP . In July he developed acute LLQ pain and nausea. Pain was worse with eating. Started bland diet and pain resolved. He had a several month reprieve then last week woke up during night with severe LLQ pain, this time associated with vomiting and diarrhea. Saw PCP, labs unremarkable, started on Cipro / flagyl for possible diverticulitis.  Returned to PCP 4/2 for worsening symptoms.  Repeat labs normal.  CT scan unrevealing except for incomplete distention of rectosigmoid colon. Yesterday he felt terrible, went to ED. repeat labs were again normal.  Repeat CT scan done (non-contrast) and unremarkable.  Still on antibiotics, today patient feels fine . So far no diarrhea, nausea nor abdominal pain today.  No diarrhea today but previously having multiple loose stools a day since last week. Stools were non-bloody but he did see blood when wiping couple of months ago. His weight is stable.  Its onset of GI symptoms  late March patient has been following a bland diet  No other GI complaints.   Past Medical History:  Diagnosis Date  . Alcoholism (HCC)   . Allergy    seasonal.    Past Surgical History:  Procedure Laterality Date  . NO PAST SURGERIES     Family History  Problem Relation Age of Onset  . Melanoma Mother   . Diverticulitis Paternal Grandmother   . Parkinson's disease Paternal Grandfather   . Alzheimer's disease Paternal Grandfather    Social History   Tobacco Use  . Smoking status: Current Some Day Smoker    Packs/day: 1.00    Years: 22.00    Pack years: 22.00  . Smokeless tobacco: Never Used  . Tobacco comment: did counsel.  Substance Use Topics  . Alcohol use: Not Currently    Comment: quit 11/12018  . Drug use: No   Current Outpatient Medications  Medication Sig Dispense Refill  . buPROPion (WELLBUTRIN SR) 150 MG 12 hr tablet TAKE 1 TABLET BY MOUTH DAILY X 3 DAYS, THEN 1 TABLET TWICE DAILY 63 tablet 1  . ciprofloxacin (CIPRO) 500 MG tablet Take 1 tablet (500 mg total) by mouth 2 (two) times daily. 14 tablet 0  . loperamide (IMODIUM) 2 MG capsule Take 1 capsule (2 mg total) by mouth 4 (four) times daily as needed for diarrhea or loose stools. 12 capsule 0  . metoCLOPramide (REGLAN) 10 MG tablet Take 1 tablet (10 mg total) by mouth every 6 (six) hours. 30 tablet 0  . metroNIDAZOLE (FLAGYL) 500 MG tablet Take 1  tablet (500 mg total) by mouth 3 (three) times daily. 21 tablet 0  . ondansetron (ZOFRAN ODT) 8 MG disintegrating tablet Take 1 tablet (8 mg total) by mouth every 8 (eight) hours as needed for nausea or vomiting. 20 tablet 0   No current facility-administered medications for this visit.    Allergies  Allergen Reactions  . Soma [Carisoprodol]      Review of Systems: All systems reviewed and negative except where noted in HPI.    Physical Exam:    Wt Readings from Last 3 Encounters:  11/12/17 139 lb 8 oz (63.3 kg)  11/11/17 141 lb 3.2 oz (64 kg)    11/10/17 144 lb 12.8 oz (65.7 kg)    BP 108/70 (BP Location: Left Arm, Patient Position: Sitting, Cuff Size: Normal)   Pulse 64   Ht 5' 10.75" (1.797 m) Comment: height measured without shoes  Wt 139 lb 8 oz (63.3 kg)   BMI 19.59 kg/m  Constitutional:  Well-developed, white male in no acute distress. Psychiatric: Normal mood and affect. Behavior is normal. EENT: Pupils normal.  Conjunctivae are normal. No scleral icterus. Neck supple.  Cardiovascular: Normal rate, regular rhythm. No edema Pulmonary/chest: Effort normal and breath sounds normal. No wheezing, rales or rhonchi. Abdominal: Soft, nondistended. Nontender. Bowel sounds active throughout. There are no masses palpable. No hepatomegaly. Neurological: Alert and oriented to person place and time. Skin: Skin is warm and dry. No rashes noted.  Willette ClusterPaula Reegan Mctighe, NP  11/12/2017, 10:36 AM  Cc: Esperanza RichtersSaguier, Edward, PA-C

## 2017-11-12 NOTE — Patient Instructions (Addendum)
If you are age 43 or older, your body mass index should be between 23-30. Your Body mass index is 19.59 kg/m. If this is out of the aforementioned range listed, please consider follow up with your Primary Care Provider.  If you are age 43 or younger, your body mass index should be between 19-25. Your Body mass index is 19.59 kg/m. If this is out of the aformentioned range listed, please consider follow up with your Primary Care Provider.   You have been scheduled for a colonoscopy. Please follow written instructions given to you at your visit today.  Please pick up your prep supplies at the pharmacy within the next 1-3 days. If you use inhalers (even only as needed), please bring them with you on the day of your procedure. Your physician has requested that you go to www.startemmi.com and enter the access code given to you at your visit today. This web site gives a general overview about your procedure. However, you should still follow specific instructions given to you by our office regarding your preparation for the procedure.  We have sent the following medications to your pharmacy for you to pick up at your convenience: Suprep  Thank you for choosing [facility], Willette ClusterPaula Guenther, NP

## 2017-11-17 ENCOUNTER — Ambulatory Visit (HOSPITAL_BASED_OUTPATIENT_CLINIC_OR_DEPARTMENT_OTHER)
Admission: RE | Admit: 2017-11-17 | Discharge: 2017-11-17 | Disposition: A | Discharge status: Home / Self Care | Payer: BLUE CROSS/BLUE SHIELD | Source: Ambulatory Visit | Attending: Medical | Admitting: Medical

## 2017-11-17 ENCOUNTER — Ambulatory Visit (INDEPENDENT_AMBULATORY_CARE_PROVIDER_SITE_OTHER): Payer: BLUE CROSS/BLUE SHIELD | Admitting: Medical

## 2017-11-17 ENCOUNTER — Encounter: Payer: Self-pay | Admitting: Medical

## 2017-11-17 ENCOUNTER — Telehealth: Payer: Self-pay | Admitting: Medical

## 2017-11-17 VITALS — BP 128/84 | HR 87 | Temp 98.2°F | Resp 16 | Ht 73.0 in | Wt 137.8 lb

## 2017-11-17 DIAGNOSIS — R059 Cough, unspecified: Secondary | ICD-10-CM

## 2017-11-17 DIAGNOSIS — Z87891 Personal history of nicotine dependence: Secondary | ICD-10-CM

## 2017-11-17 DIAGNOSIS — R195 Other fecal abnormalities: Secondary | ICD-10-CM | POA: Diagnosis not present

## 2017-11-17 DIAGNOSIS — R61 Generalized hyperhidrosis: Secondary | ICD-10-CM

## 2017-11-17 DIAGNOSIS — R109 Unspecified abdominal pain: Secondary | ICD-10-CM

## 2017-11-17 DIAGNOSIS — R05 Cough: Secondary | ICD-10-CM | POA: Insufficient documentation

## 2017-11-17 LAB — CBC WITH DIFFERENTIAL/PLATELET
Basophils Absolute: 0.1 10*3/uL (ref 0.0–0.1)
Basophils Relative: 1 % (ref 0.0–3.0)
Eosinophils Absolute: 0.3 10*3/uL (ref 0.0–0.7)
Eosinophils Relative: 4.7 % (ref 0.0–5.0)
HCT: 44.4 % (ref 39.0–52.0)
Hemoglobin: 15.2 g/dL (ref 13.0–17.0)
Lymphocytes Relative: 29.1 % (ref 12.0–46.0)
Lymphs Abs: 1.6 10*3/uL (ref 0.7–4.0)
MCHC: 34.3 g/dL (ref 30.0–36.0)
MCV: 91 fl (ref 78.0–100.0)
Monocytes Absolute: 0.6 10*3/uL (ref 0.1–1.0)
Monocytes Relative: 10.4 % (ref 3.0–12.0)
Neutro Abs: 3 10*3/uL (ref 1.4–7.7)
Neutrophils Relative %: 54.8 % (ref 43.0–77.0)
Platelets: 296 10*3/uL (ref 150.0–400.0)
RBC: 4.88 Mil/uL (ref 4.22–5.81)
RDW: 13.4 % (ref 11.5–15.5)
WBC: 5.5 10*3/uL (ref 4.0–10.5)

## 2017-11-17 LAB — COMPREHENSIVE METABOLIC PANEL
ALT: 32 U/L (ref 0–53)
AST: 23 U/L (ref 0–37)
Albumin: 4.3 g/dL (ref 3.5–5.2)
Alkaline Phosphatase: 30 U/L — ABNORMAL LOW (ref 39–117)
BUN: 13 mg/dL (ref 6–23)
CO2: 31 mEq/L (ref 19–32)
Calcium: 9.3 mg/dL (ref 8.4–10.5)
Chloride: 102 mEq/L (ref 96–112)
Creatinine, Ser: 0.86 mg/dL (ref 0.40–1.50)
GFR: 103.12 mL/min (ref >60.00)
Glucose, Bld: 82 mg/dL (ref 70–99)
Potassium: 4.3 mEq/L (ref 3.5–5.1)
Sodium: 138 mEq/L (ref 135–145)
Total Bilirubin: 0.5 mg/dL (ref 0.2–1.2)
Total Protein: 6.7 g/dL (ref 6.0–8.3)

## 2017-11-17 MED ORDER — DICYCLOMINE HCL 10 MG PO CAPS: 10.0000 mg | ORAL_CAPSULE | Freq: Three times a day (TID) | ORAL | 0 refills | Status: DC

## 2017-11-17 MED ORDER — ONDANSETRON 4 MG PO TBDP: 4.0000 mg | ORAL_TABLET | Freq: Three times a day (TID) | ORAL | 0 refills | Status: DC | PRN

## 2017-11-17 NOTE — Telephone Encounter (Signed)
Copied from CRM [id number]. Topic: Quick Communication - See Telephone Encounter >> Nov 17, 2017 10:06 AM Valentina LucksMatos, Jackelin wrote: CRM for notification. See Telephone encounter for: 11/17/17.   Pt dropped off document to be filled out by provider (FMLA - 5 pages) Pt would like to be called when document ready to pick up at [phone]. Document put at front office tray under provider.

## 2017-11-17 NOTE — Progress Notes (Signed)
Subjective:    Patient ID: Ronald Berg, male    DOB: [birth year], 42 y.o.   MRN: [id number]  HPI  Pt in states he still has diarrhea about 5 loose stools today. Pt nausea is a lot less.  The ifob panel was negative. The GI panel was negative.  Pt did go to the ED the other day as I recommended. He eventually also made appointment with GI.Marland Kitchen.  Pt has colonoscocpy on May 2019. GI did rx reglan and pt tried immodium.  Pt has some intermittent abdomen cramps. Some suprapubic pain and left lower quadrant pain this am. Pt has been sticking to bland diet. Pt accidentally ate red and bell pepper rice and symptoms flared again.  Pt does have hx of smoking. Less smoking recently since sick with the above.    Review of Systems  Constitutional: Negative for chills, fatigue and fever.       Some occasional night sweats.  Respiratory: Positive for cough. Negative for chest tightness, shortness of breath and wheezing.        Rare  Cardiovascular: Negative for chest pain and palpitations.  Gastrointestinal: Positive for abdominal pain, diarrhea and nausea. Negative for abdominal distention, blood in stool, constipation and vomiting.       Nausea is less.  Musculoskeletal: Negative for back pain and gait problem.  Skin: Negative for rash.  Neurological: Negative for dizziness, syncope, speech difficulty, numbness and headaches.  Hematological: Negative for adenopathy. Does not bruise/bleed easily.  Psychiatric/Behavioral: Negative for behavioral problems, confusion and dysphoric mood. The patient is not nervous/anxious and is not hyperactive.      Past Medical History:  Diagnosis Date  . Alcoholism (HCC)   . Allergy    seasonal.     Social History   Socioeconomic History  . Marital status: Married    Spouse name: Not on file  . Number of children: 3  . Years of education: Not on file  . Highest education level: Not on file  Occupational History  . Occupation: Film/video editorffice worker    Social Needs  . Financial resource strain: Not on file  . Food insecurity:    Worry: Not on file    Inability: Not on file  . Transportation needs:    Medical: Not on file    Non-medical: Not on file  Tobacco Use  . Smoking status: Current Some Day Smoker    Packs/day: 1.00    Years: 22.00    Pack years: 22.00  . Smokeless tobacco: Never Used  . Tobacco comment: did counsel.  Substance and Sexual Activity  . Alcohol use: Not Currently    Comment: quit 11/12018  . Drug use: No  . Sexual activity: Yes  Lifestyle  . Physical activity:    Days per week: Not on file    Minutes per session: Not on file  . Stress: Not on file  Relationships  . Social connections:    Talks on phone: Not on file    Gets together: Not on file    Attends religious service: Not on file    Active member of club or organization: Not on file    Attends meetings of clubs or organizations: Not on file    Relationship status: Not on file  . Intimate partner violence:    Fear of current or ex partner: Not on file    Emotionally abused: Not on file    Physically abused: Not on file    Forced sexual activity: Not on  file  Other Topics Concern  . Not on file  Social History Narrative  . Not on file    Past Surgical History:  Procedure Laterality Date  . NO PAST SURGERIES      Family History  Problem Relation Age of Onset  . Melanoma Mother   . Diverticulitis Paternal Grandmother   . Parkinson's disease Paternal Grandfather   . Alzheimer's disease Paternal Grandfather     Allergies  Allergen Reactions  . Soma [Carisoprodol]     Current Outpatient Medications on File Prior to Visit  Medication Sig Dispense Refill  . buPROPion (WELLBUTRIN SR) 150 MG 12 hr tablet TAKE 1 TABLET BY MOUTH DAILY X 3 DAYS, THEN 1 TABLET TWICE DAILY 63 tablet 1  . ciprofloxacin (CIPRO) 500 MG tablet Take 1 tablet (500 mg total) by mouth 2 (two) times daily. 14 tablet 0  . loperamide (IMODIUM) 2 MG capsule Take 1  capsule (2 mg total) by mouth 4 (four) times daily as needed for diarrhea or loose stools. 12 capsule 0  . metoCLOPramide (REGLAN) 10 MG tablet Take 1 tablet (10 mg total) by mouth every 6 (six) hours. 30 tablet 0  . metroNIDAZOLE (FLAGYL) 500 MG tablet Take 1 tablet (500 mg total) by mouth 3 (three) times daily. 21 tablet 0  . ondansetron (ZOFRAN ODT) 8 MG disintegrating tablet Take 1 tablet (8 mg total) by mouth every 8 (eight) hours as needed for nausea or vomiting. 20 tablet 0   No current facility-administered medications on file prior to visit.     BP 128/84   Pulse 87   Temp 98.2 F (36.8 C) (Oral)   Resp 16   Ht 6\' 1"  (1.854 m)   Wt 137 lb 12.8 oz (62.5 kg)   SpO2 98%   BMI 18.18 kg/m        Objective:   Physical Exam   General Appearance- Not in acute distress.  HEENT Eyes- Scleraeral/Conjuntiva-bilat- Not Yellow. Mouth & Throat- Normal.  Chest and Lung Exam Auscultation: Breath sounds:-Normal. Adventitious sounds:- No Adventitious sounds.  Cardiovascular Auscultation:Rythm - Regular. Heart Sounds -Normal heart sounds.  Abdomen Inspection:-Inspection Normal.  Palpation/Perucssion: Palpation and Percussion of the abdomen reveal- very faint left lower quadrant  Tender, No Rebound tenderness, No rigidity(Guarding) and No Palpable abdominal masses.  Liver:-Normal.  Spleen:- Normal.   Back- no cva tenderness       Assessment & Plan:  For your persisting loose stools and intermittent persistent abdomen pain will rx bentyl. CT negative and stool panel studies negative. So ibs in differential. However night sweats symptoms that does not match ibs. Do want to get cbc to check infection fighting cells, recheck cmp and get cxr.( due to history of smoking).  Continue bland foods. Hydrate well.  Rx of bentyl today to decrease loose stools. Rx zofran for nauseau or vomiting.  Will follow GI studies. Notify me if flare of constant severe pain again.  Follow up 7  days or as needed   [employer], [employer]

## 2017-11-17 NOTE — Patient Instructions (Addendum)
For your persisting loose stools and intermittent persistent abdomen pain will rx bentyl. CT negative and stool panel studies negative. So ibs in differential. However night sweats symptoms that does not match ibs. Do want to get cbc to check infection fighting cells, recheck cmp and get cxr due to history of smoking.  Rx of bentyl today to decrease loose stools. Rx zofran for nauseau or vomiting.  Continue bland foods. Hydrate well.  Will follow GI studies. Notify me if flare of constant severe pain again.  Follow up 7 days or as needed

## 2017-11-23 NOTE — Telephone Encounter (Signed)
Pt calling to check status on his FMLA paperwork being filled out.

## 2017-11-24 NOTE — Telephone Encounter (Signed)
All FMLA/Disability paperwork has a 5-7 Business day turn around for completion; received on 11/17/17, will be completed by Thursday, 11/26/17.Thanks/SLS 04/16

## 2017-11-24 NOTE — Telephone Encounter (Signed)
Received FMLA/STD paperwork from employer, completed as much as possible; forwarded to provider/SLS 04/16

## 2017-11-25 ENCOUNTER — Encounter: Payer: Self-pay | Admitting: Internal Medicine

## 2017-11-25 NOTE — Telephone Encounter (Signed)
LMOM with contact name and number for return call RE: needed fax number to send completed paperwork to and/or regular business hours if he would like to pick-up/SLS 04/17

## 2017-12-01 ENCOUNTER — Ambulatory Visit: Payer: BLUE CROSS/BLUE SHIELD | Admitting: Physician Assistant

## 2017-12-07 ENCOUNTER — Ambulatory Visit (AMBULATORY_SURGERY_CENTER): Payer: BLUE CROSS/BLUE SHIELD | Admitting: Internal Medicine

## 2017-12-07 ENCOUNTER — Encounter: Payer: Self-pay | Admitting: Internal Medicine

## 2017-12-07 VITALS — BP 117/63 | HR 56 | Temp 98.4°F | Resp 11 | Ht 70.0 in | Wt 139.0 lb

## 2017-12-07 DIAGNOSIS — K625 Hemorrhage of anus and rectum: Secondary | ICD-10-CM

## 2017-12-07 DIAGNOSIS — R197 Diarrhea, unspecified: Secondary | ICD-10-CM

## 2017-12-07 MED ORDER — SODIUM CHLORIDE 0.9 % IV SOLN: 500.0000 mL | Freq: Once | INTRAVENOUS | Status: DC

## 2017-12-07 NOTE — Op Note (Signed)
[facility] Patient Name: Ronald Berg Procedure Date: 12/07/2017 10:07 AM MRN: [id number] Endoscopist: Wilhemina Bonito. Marina Goodell , MD Age: 42 Referring MD:  Date of Birth: 18-Jun-1976 Gender: Male Account #: [id number] Procedure:                Colonoscopy, with biopsies Indications:              Clinically significant diarrhea of unexplained                            origin, Rectal bleeding Medicines:                Monitored Anesthesia Care Procedure:                Pre-Anesthesia Assessment:                           - Prior to the procedure, a History and Physical                            was performed, and patient medications and                            allergies were reviewed. The patient's tolerance of                            previous anesthesia was also reviewed. The risks                            and benefits of the procedure and the sedation                            options and risks were discussed with the patient.                            All questions were answered, and informed consent                            was obtained. Prior Anticoagulants: The patient has                            taken no previous anticoagulant or antiplatelet                            agents. ASA Grade Assessment: I - A normal, healthy                            patient. After reviewing the risks and benefits,                            the patient was deemed in satisfactory condition to                            undergo the procedure.  After obtaining informed consent, the colonoscope                            was passed under direct vision. Throughout the                            procedure, the patient's blood pressure, pulse, and                            oxygen saturations were monitored continuously. The                            Colonoscope was introduced through the anus and                            advanced to the the cecum, identified  by                            appendiceal orifice and ileocecal valve. The                            ileocecal valve, appendiceal orifice, and rectum                            were photographed. The quality of the bowel                            preparation was good. The colonoscopy was performed                            without difficulty. The patient tolerated the                            procedure well. The bowel preparation used was                            SUPREP. Scope In: 10:21:09 AM Scope Out: 10:41:14 AM Scope Withdrawal Time: 0 hours 18 minutes 26 seconds  Total Procedure Duration: 0 hours 20 minutes 5 seconds  Findings:                 The entire examined colon appeared normal on direct                            and retroflexion views. Biopsies for histology were                            taken with a cold forceps from the entire colon for                            evaluation of microscopic colitis. Complications:            No immediate complications. Estimated blood loss:  None. Estimated Blood Loss:     Estimated blood loss: none. Impression:               - The entire examined colon is normal on direct and                            retroflexion views. Recommendation:           - Repeat colonoscopy in 10 years for screening                            purposes.                           - Patient has a contact number available for                            emergencies. The signs and symptoms of potential                            delayed complications were discussed with the                            patient. Return to normal activities tomorrow.                            Written discharge instructions were provided to the                            patient.                           - Resume previous diet.                           - Continue present medications.                           - Await pathology results.                            - Daily fiber supplementation with Metamucil 2                            tablespoons daily                           - Please schedule office follow-up with Dr. Marina Goodell                            in 6 weeks Wilhemina Bonito. Marina Goodell, MD 12/07/2017 10:50:37 AM This report has been signed electronically.

## 2017-12-07 NOTE — Progress Notes (Signed)
Called to room to assist during endoscopic procedure.  Patient ID and intended procedure confirmed with present staff. Received instructions for my participation in the procedure from the performing physician.  

## 2017-12-07 NOTE — Progress Notes (Signed)
Pt's states no medical or surgical changes since previsit or office visit. 

## 2017-12-07 NOTE — Progress Notes (Signed)
To PACU, vss patent aw report to rn 

## 2017-12-07 NOTE — Patient Instructions (Signed)
YOU HAD AN ENDOSCOPIC PROCEDURE TODAY AT THE [facility]:   Refer to the procedure report that was given to you for any specific questions about what was found during the examination.  If the procedure report does not answer your questions, please call your gastroenterologist to clarify.  If you requested that your care partner not be given the details of your procedure findings, then the procedure report has been included in a sealed envelope for you to review at your convenience later.  YOU SHOULD EXPECT: Some feelings of bloating in the abdomen. Passage of more gas than usual.  Walking can help get rid of the air that was put into your GI tract during the procedure and reduce the bloating. If you had a lower endoscopy (such as a colonoscopy or flexible sigmoidoscopy) you may notice spotting of blood in your stool or on the toilet paper. If you underwent a bowel prep for your procedure, you may not have a normal bowel movement for a few days.  Please Note:  You might notice some irritation and congestion in your nose or some drainage.  This is from the oxygen used during your procedure.  There is no need for concern and it should clear up in a day or so.  SYMPTOMS TO REPORT IMMEDIATELY:   Following lower endoscopy (colonoscopy or flexible sigmoidoscopy):  Excessive amounts of blood in the stool  Significant tenderness or worsening of abdominal pains  Swelling of the abdomen that is new, acute  Fever of 100F or higher   For urgent or emergent issues, a gastroenterologist can be reached at any hour by calling [phone].   DIET:  We do recommend a small meal at first, but then you may proceed to your regular diet.  Drink plenty of fluids but you should avoid alcoholic beverages for 24 hours.  ACTIVITY:  You should plan to take it easy for the rest of today and you should NOT DRIVE or use heavy machinery until tomorrow (because of the sedation medicines used during the test).     FOLLOW UP: Our staff will call the number listed on your records the next business day following your procedure to check on you and address any questions or concerns that you may have regarding the information given to you following your procedure. If we do not reach you, we will leave a message.  However, if you are feeling well and you are not experiencing any problems, there is no need to return our call.  We will assume that you have returned to your regular daily activities without incident.  If any biopsies were taken you will be contacted by phone or by letter within the next 1-3 weeks.  Please call us at [phone] if you have not heard about the biopsies in 3 weeks.    SIGNATURES/CONFIDENTIALITY: You and/or your care partner have signed paperwork which will be entered into your electronic medical record.  These signatures attest to the fact that that the information above on your After Visit Summary has been reviewed and is understood.  Full responsibility of the confidentiality of this discharge information lies with you and/or your care-partner.     You may resume your current medications today. Per Dr. Marina Goodell take daily fiber supplement with Metamucil 2 TBSP daily. Await biopsy results. Please follow up in the office with Dr. Marina Goodell in 6 weeks.  The office will call you with this appointment. Please call if any questions or concerns.

## 2017-12-07 NOTE — Progress Notes (Signed)
No problems noted in the recovery room. maw 

## 2017-12-08 ENCOUNTER — Telehealth: Payer: Self-pay | Admitting: *Deleted

## 2017-12-08 NOTE — Telephone Encounter (Signed)
  Follow up Call-  Call back number 12/07/2017  Post procedure Call Back phone  # [phone]  Permission to leave phone message Yes  Some recent data might be hidden     Patient questions:  Do you have a fever, pain , or abdominal swelling? No. Pain Score  0 *  Have you tolerated food without any problems? Yes.    Have you been able to return to your normal activities? Yes.    Do you have any questions about your discharge instructions: Diet   No. Medications  No. Follow up visit  No.  Do you have questions or concerns about your Care? No.  Actions: * If pain score is 4 or above: No action needed, pain <4.

## 2017-12-11 ENCOUNTER — Encounter: Payer: Self-pay | Admitting: Internal Medicine

## 2017-12-21 ENCOUNTER — Encounter: Payer: BLUE CROSS/BLUE SHIELD | Admitting: Internal Medicine

## 2018-01-20 ENCOUNTER — Ambulatory Visit: Payer: BLUE CROSS/BLUE SHIELD | Admitting: Internal Medicine

## 2019-07-30 IMAGING — CT CT ABD-PELV W/O CM
2 of 4 series · 16 of 46 positions shown, 18 images · non-contrast
Comparison: 11/10/2017 CT abdomen and pelvis.

CLINICAL DATA: 42 y/o M; 1 week of sharp abdominal pain, nausea,
and vomiting.

EXAM:
CT ABDOMEN AND PELVIS WITHOUT CONTRAST
TECHNIQUE: Multidetector CT imaging of the abdomen and pelvis was performed
following the standard protocol without IV contrast.

[Series 2: axial st · axial · 0.77mm/px · z∈[+628,+1068]mm · 13 of 98 slices shown, 15 images]
[im 5/98  soft-tissue]
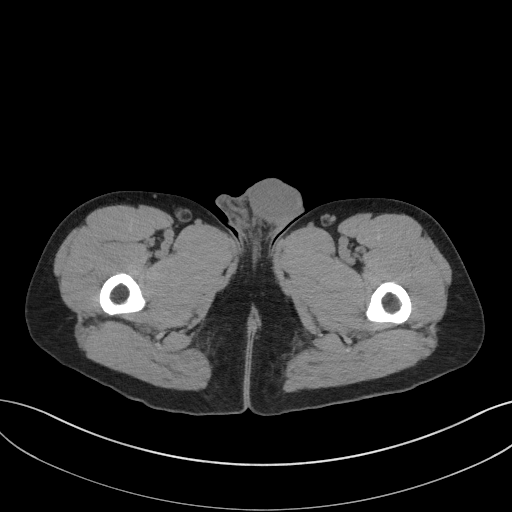
[im 5/98  bone]
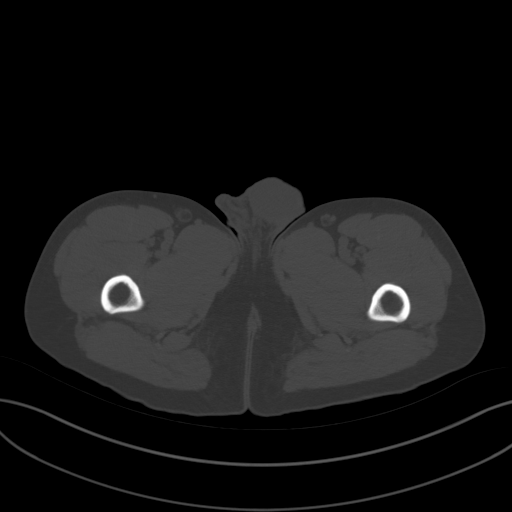
[im 13/98  soft-tissue]
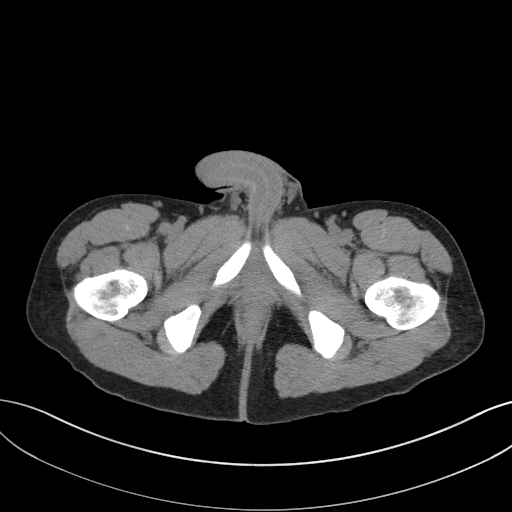
[im 22/98  soft-tissue]
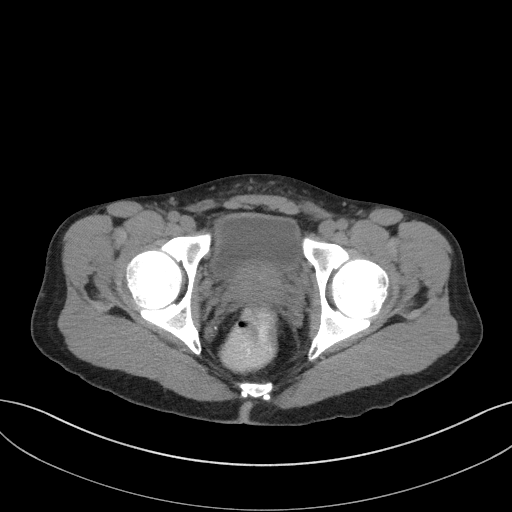
[im 26/98  soft-tissue]
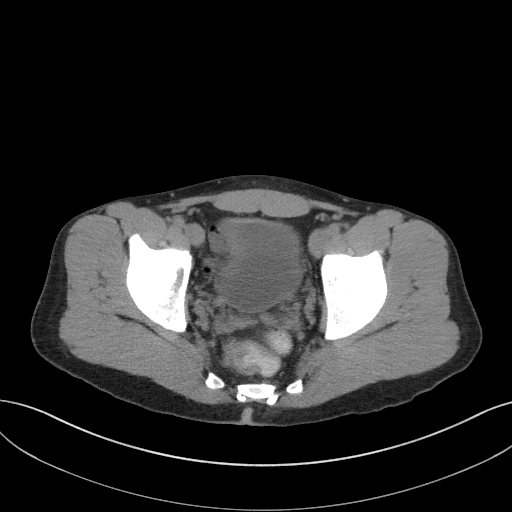
[im 34/98  soft-tissue]
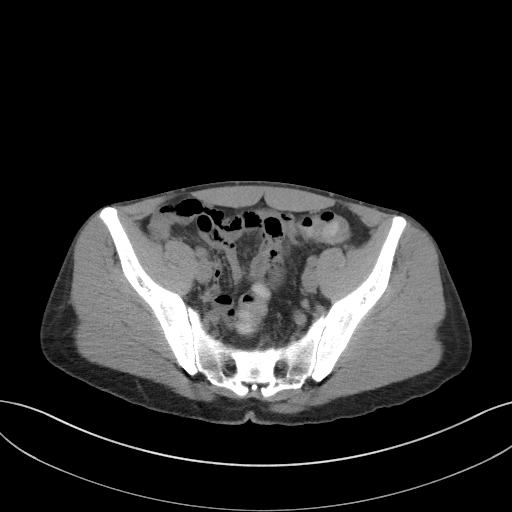
[im 43/98  soft-tissue]
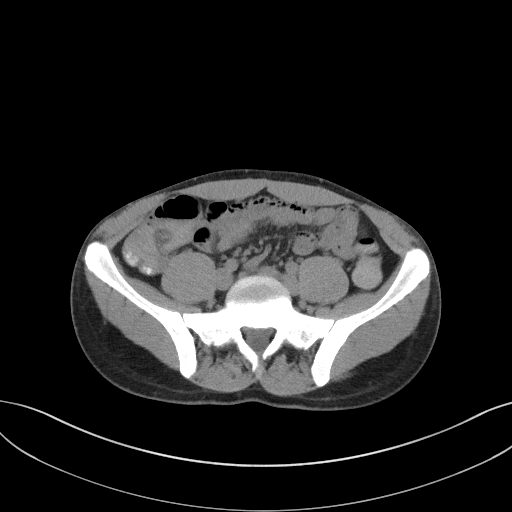
[im 51/98  soft-tissue]
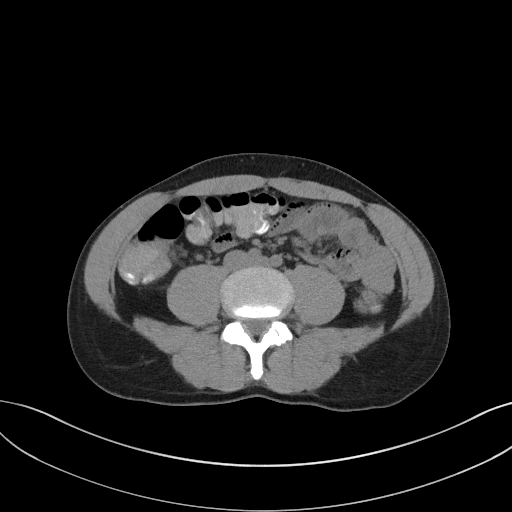
[im 55/98  soft-tissue]
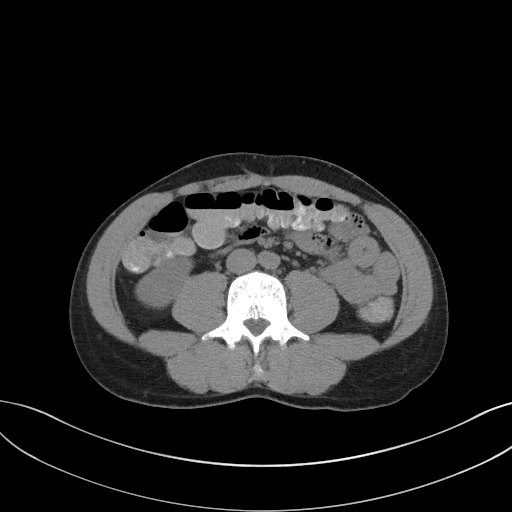
[im 64/98  soft-tissue]
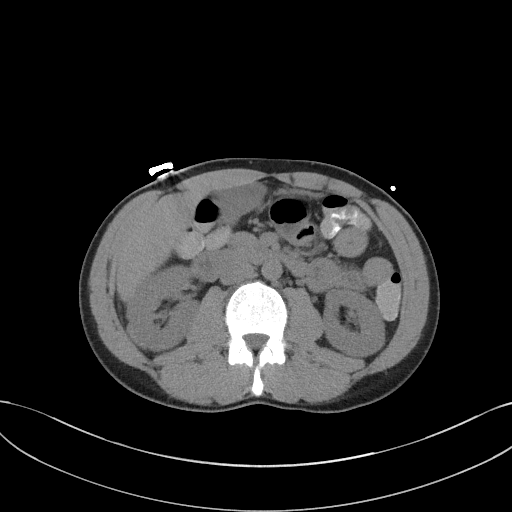
[im 64/98  bone]
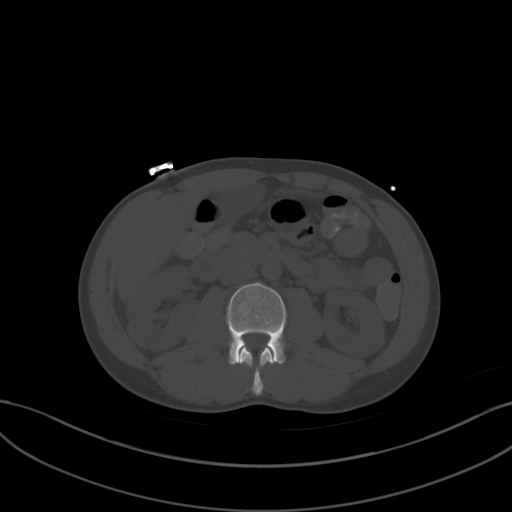
[im 72/98  soft-tissue]
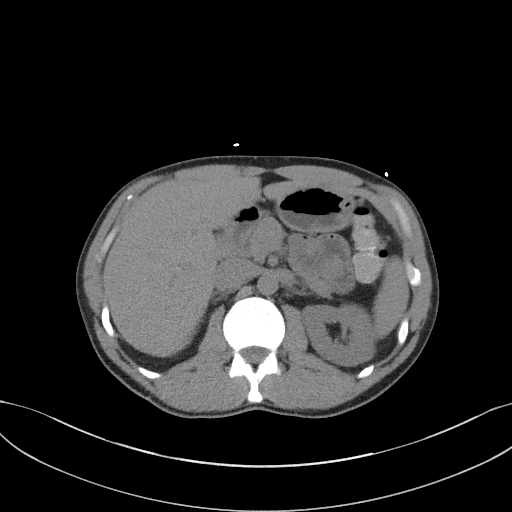
[im 76/98  soft-tissue]
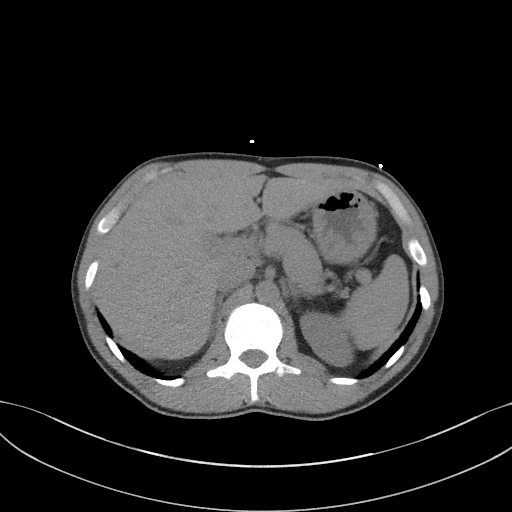
[im 85/98  soft-tissue]
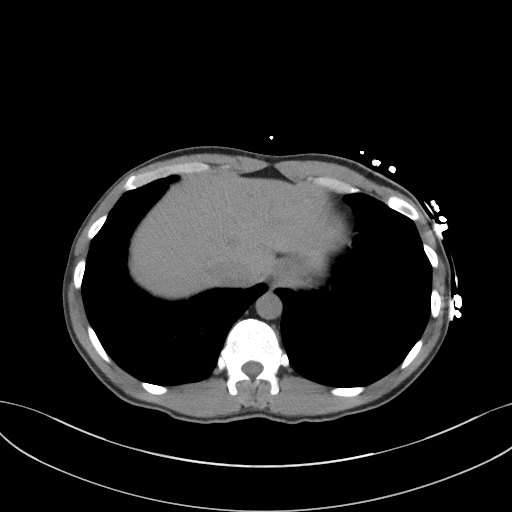
[im 93/98  soft-tissue]
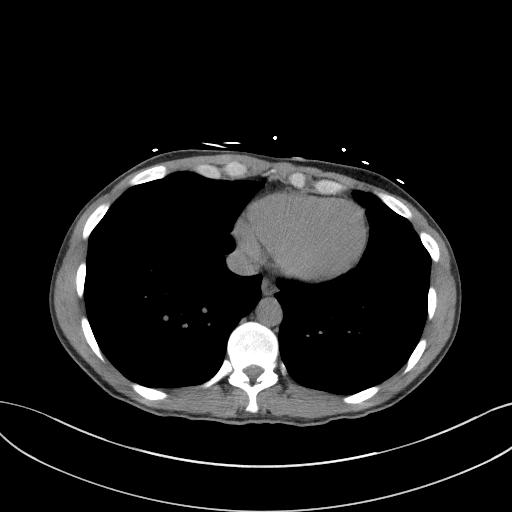

[Series 5: coronal st · coronal · 0.77mm/px · 3 of 69 slices shown]
[im 23/69  soft-tissue]
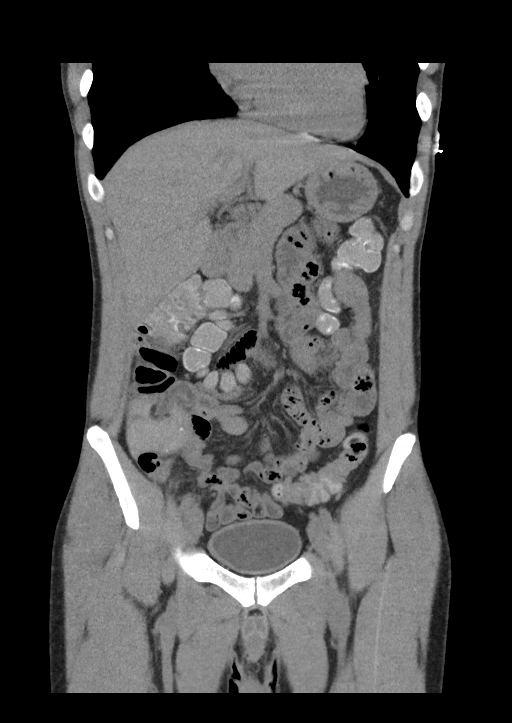
[im 31/69  soft-tissue]
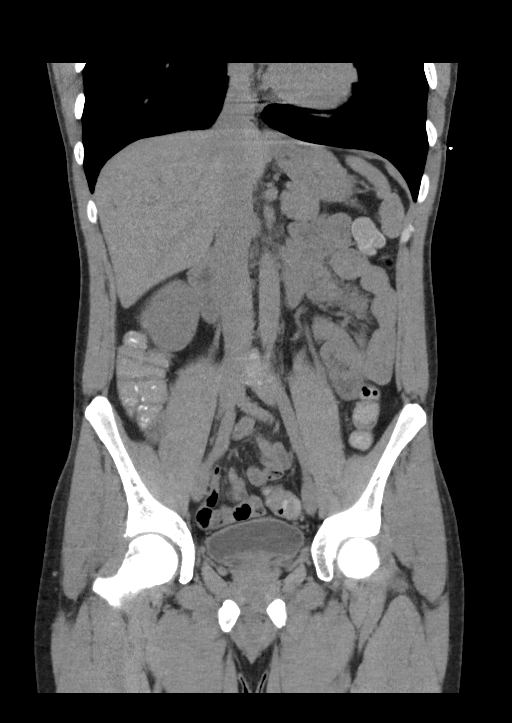
[im 38/69  soft-tissue]
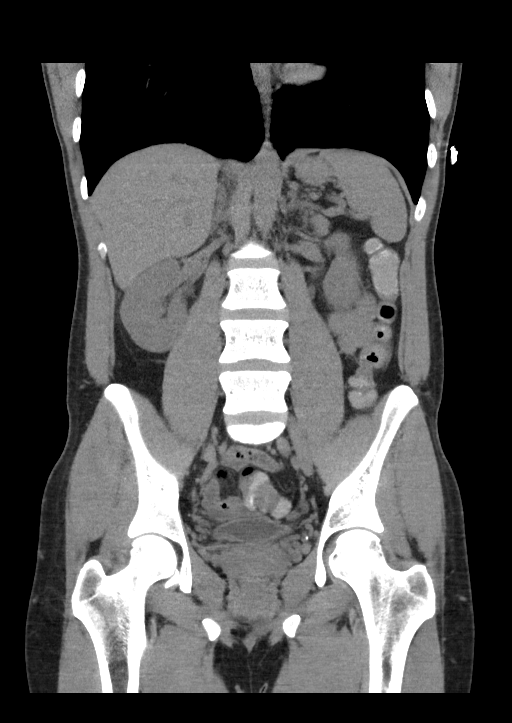

[16 of 46 positions shown; findings below may reference images not displayed]

FINDINGS: Lower chest: No acute abnormality.

Hepatobiliary: Stable subcentimeter cyst in right lobe of liver.
Otherwise no focal liver abnormality is seen. No gallstones,
gallbladder wall thickening, or biliary dilatation.

Pancreas: Unremarkable. No pancreatic ductal dilatation or
surrounding inflammatory changes.

Spleen: Normal in size without focal abnormality.

Adrenals/Urinary Tract: Adrenal glands are unremarkable. Kidneys are
normal, without renal calculi, focal lesion, or hydronephrosis.
Bladder is unremarkable.

Stomach/Bowel: Stomach is within normal limits. Appendix not
identified, no pericecal inflammation. No evidence of bowel wall
thickening, distention, or inflammatory changes.

Vascular/Lymphatic: No significant vascular findings are present. No
enlarged abdominal or pelvic lymph nodes.

Reproductive: Prostate is unremarkable.

Other: No abdominal wall hernia or abnormality. No abdominopelvic
ascites.

Musculoskeletal: No fracture is seen.
IMPRESSION: No acute process identified. Stable negative CT of the abdomen and
pelvis.

By: Phu Sarr M.D.

## 2019-08-05 IMAGING — DX DG CHEST 2V
2 series · 2 of 2 positions shown · non-contrast
Comparison: None.

CLINICAL DATA: Pain with nausea.  Tobacco use.

EXAM:
CHEST - 2 VIEW

[chest pa]
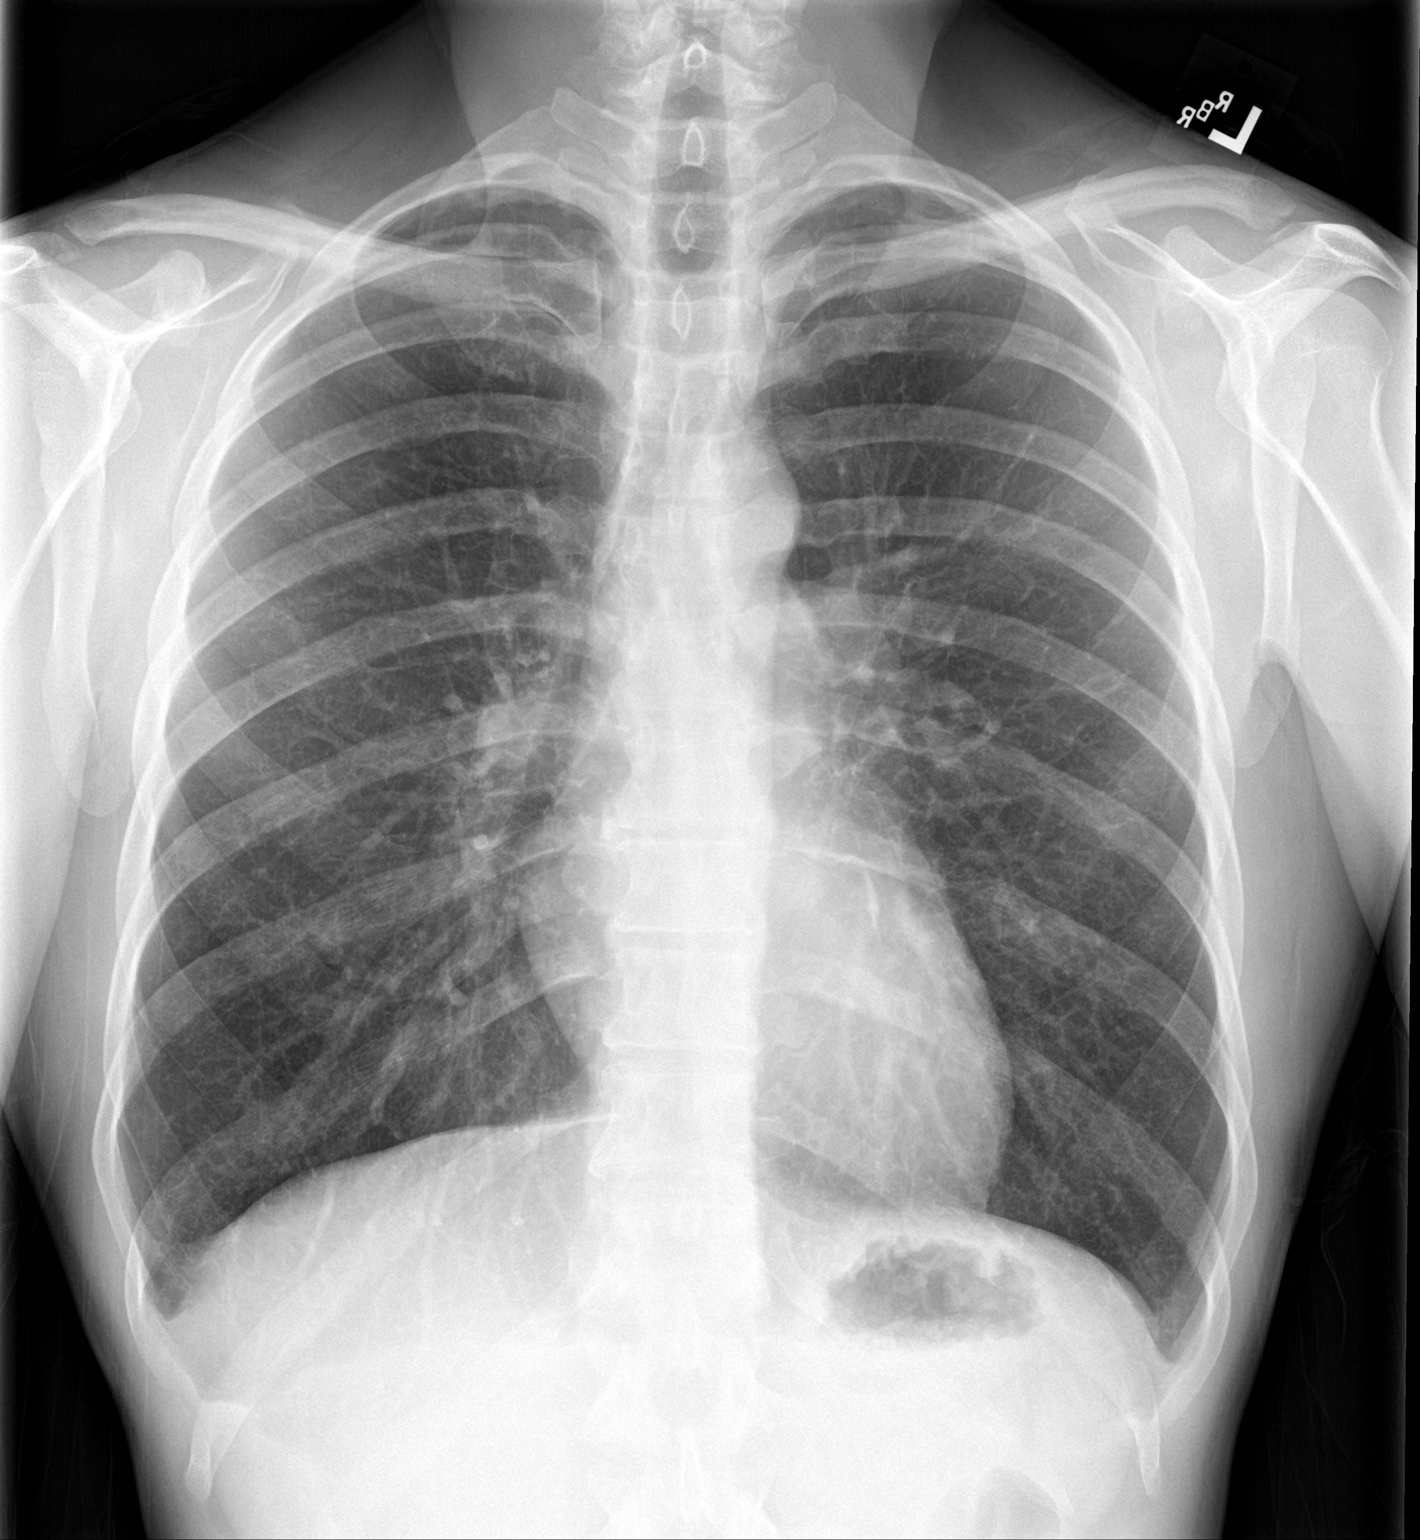

[chest lat]
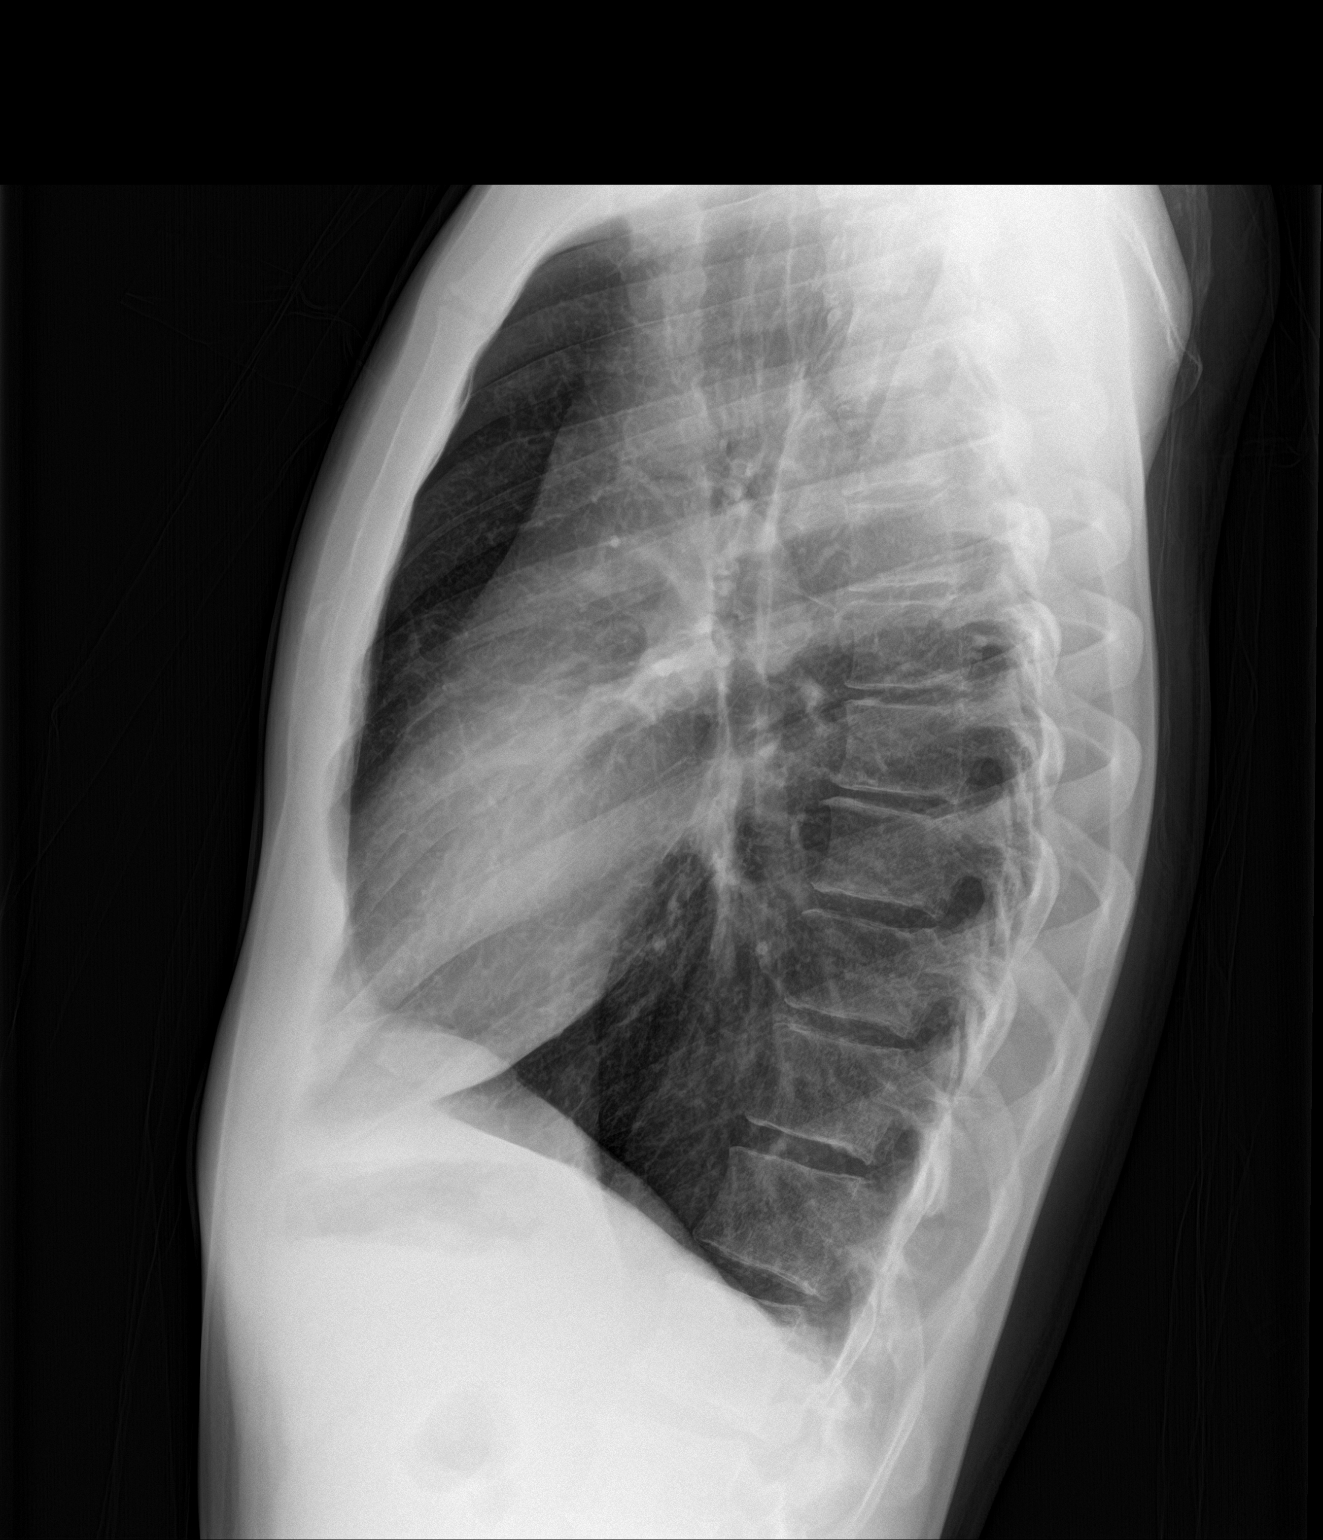

[2 of 2 positions shown; findings below may reference images not displayed]

FINDINGS: Lungs are clear. Heart size and pulmonary vascularity are normal. No
adenopathy. No bone lesions.
IMPRESSION: No edema or consolidation.

## 2023-07-30 ENCOUNTER — Encounter: Payer: Self-pay | Admitting: Family Medicine

## 2023-07-30 ENCOUNTER — Ambulatory Visit: Payer: Medicaid Other | Admitting: Family Medicine

## 2023-07-30 VITALS — BP 133/71 | HR 64 | Temp 98.0°F | Resp 18 | Ht 72.0 in | Wt 154.2 lb

## 2023-07-30 DIAGNOSIS — Z7689 Persons encountering health services in other specified circumstances: Secondary | ICD-10-CM

## 2023-07-30 DIAGNOSIS — H9201 Otalgia, right ear: Secondary | ICD-10-CM | POA: Diagnosis not present

## 2023-07-30 DIAGNOSIS — J018 Other acute sinusitis: Secondary | ICD-10-CM

## 2023-07-30 MED ORDER — AMOXICILLIN-POT CLAVULANATE 875-125 MG PO TABS: 1.0000 | ORAL_TABLET | Freq: Two times a day (BID) | ORAL | 0 refills | Status: DC

## 2023-07-30 NOTE — Progress Notes (Signed)
New Patient Office Visit  Subjective    Patient ID: Ronald Berg, male    DOB: 21-Aug-1975  Age: 47 y.o. MRN: [id number]  CC:  Chief Complaint  Patient presents with   Establish Care    Patient is her to establish care with new PCP he states that he has been having an earache (left) for about 2 weeks , he is also experiencing a dull headache on very top of head only on left side     HPI Ronald Berg presents to establish care. Pt is new to me.  Pt reports 2 week hx of right ear pain, intermittently. He would take Ibuprofen for this. The pain would come and go. He says now there's also a head pain on the right side. He denies pain when eating. No sore throat or recent URI symptoms. He is relatively healthy. Taking daily supplements and exercising. Not taking any medicines. Non drinker and smoker. He does do nettipot at times for his allergies.    Outpatient Encounter Medications as of 07/30/2023  Medication Sig   [DISCONTINUED] buPROPion (WELLBUTRIN SR) 150 MG 12 hr tablet TAKE 1 TABLET BY MOUTH DAILY X 3 DAYS, THEN 1 TABLET TWICE DAILY   [DISCONTINUED] dicyclomine (BENTYL) 10 MG capsule Take 1 capsule (10 mg total) by mouth 4 (four) times daily -  before meals and at bedtime.   [DISCONTINUED] loperamide (IMODIUM) 2 MG capsule Take 1 capsule (2 mg total) by mouth 4 (four) times daily as needed for diarrhea or loose stools.   [DISCONTINUED] metoCLOPramide (REGLAN) 10 MG tablet Take 1 tablet (10 mg total) by mouth every 6 (six) hours.   [DISCONTINUED] ondansetron (ZOFRAN ODT) 4 MG disintegrating tablet Take 1 tablet (4 mg total) by mouth every 8 (eight) hours as needed for nausea or vomiting.   Facility-Administered Encounter Medications as of 07/30/2023  Medication   0.9 %  sodium chloride infusion    Past Medical History:  Diagnosis Date   Alcoholism (HCC)    Allergy    seasonal.    Past Surgical History:  Procedure Laterality Date   NO PAST SURGERIES      Family  History  Problem Relation Age of Onset   Melanoma Mother    Diverticulitis Paternal Grandmother    Parkinson's disease Paternal Grandfather    Alzheimer's disease Paternal Grandfather     Social History   Socioeconomic History   Marital status: Married    Spouse name: Not on file   Number of children: 3   Years of education: Not on file   Highest education level: Not on file  Occupational History   Occupation: Film/video editor  Tobacco Use   Smoking status: Some Days    Current packs/day: 1.00    Average packs/day: 1 pack/day for 22.0 years (22.0 ttl pk-yrs)    Types: Cigarettes    Passive exposure: Never   Smokeless tobacco: Never   Tobacco comments:    did counsel.  Vaping Use   Vaping status: Never Used  Substance and Sexual Activity   Alcohol use: Not Currently    Comment: quit 11/12018   Drug use: No   Sexual activity: Yes  Other Topics Concern   Not on file  Social History Narrative   Not on file   Social Drivers of Health   Financial Resource Strain: Not on file  Food Insecurity: Not on file  Transportation Needs: Not on file  Physical Activity: Not on file  Stress: Not on file  Social Connections: Not on file  Intimate Partner Violence: Not on file    Review of Systems  HENT:  Positive for ear pain.   Neurological:  Positive for headaches.  All other systems reviewed and are negative.      Objective    BP 133/71   Pulse 64   Temp 98 F (36.7 C) (Oral)   Resp 18   Ht 6' (1.829 m)   Wt 154 lb 3.2 oz (69.9 kg)   SpO2 96%   BMI 20.91 kg/m   Physical Exam Vitals and nursing note reviewed.  Constitutional:      Appearance: Normal appearance. He is normal weight.  HENT:     Head: Normocephalic and atraumatic.     Right Ear: Tympanic membrane, ear canal and external ear normal.     Left Ear: Tympanic membrane, ear canal and external ear normal.     Nose: Nose normal.     Mouth/Throat:     Mouth: Mucous membranes are moist.     Pharynx:  Oropharynx is clear.  Eyes:     Conjunctiva/sclera: Conjunctivae normal.     Pupils: Pupils are equal, round, and reactive to light.  Cardiovascular:     Rate and Rhythm: Normal rate and regular rhythm.  Pulmonary:     Effort: Pulmonary effort is normal.     Breath sounds: Normal breath sounds.  Abdominal:     General: Abdomen is flat. Bowel sounds are normal.  Skin:    General: Skin is warm.     Capillary Refill: Capillary refill takes less than 2 seconds.  Neurological:     General: No focal deficit present.     Mental Status: He is alert and oriented to person, place, and time. Mental status is at baseline.  Psychiatric:        Mood and Affect: Mood normal.        Behavior: Behavior normal.        Thought Content: Thought content normal.        Judgment: Judgment normal.       Assessment & Plan:   Problem List Items Addressed This Visit   None  Encounter to establish care with new doctor  Right ear pain -     Amoxicillin-Pot Clavulanate; Take 1 tablet by mouth 2 (two) times daily.  Dispense: 14 tablet; Refill: 0  Acute non-recurrent sinusitis of other sinus -     Amoxicillin-Pot Clavulanate; Take 1 tablet by mouth 2 (two) times daily.  Dispense: 14 tablet; Refill: 0   Suspect sinusitis as cause although symptoms are atypical. Will start with Augmentin 875 mg bid x 7 days and follow up if symptoms persists.  No follow-ups on file.   Suzan Slick, MD

## 2024-03-14 ENCOUNTER — Emergency Department (HOSPITAL_BASED_OUTPATIENT_CLINIC_OR_DEPARTMENT_OTHER)
Admission: EM | Admit: 2024-03-14 | Discharge: 2024-03-14 | Disposition: A | Discharge status: Home / Self Care | Attending: Emergency Medicine | Admitting: Emergency Medicine

## 2024-03-14 ENCOUNTER — Emergency Department (HOSPITAL_BASED_OUTPATIENT_CLINIC_OR_DEPARTMENT_OTHER)

## 2024-03-14 ENCOUNTER — Telehealth (HOSPITAL_BASED_OUTPATIENT_CLINIC_OR_DEPARTMENT_OTHER): Payer: Self-pay | Admitting: Emergency Medicine

## 2024-03-14 ENCOUNTER — Encounter (HOSPITAL_BASED_OUTPATIENT_CLINIC_OR_DEPARTMENT_OTHER): Payer: Self-pay

## 2024-03-14 ENCOUNTER — Other Ambulatory Visit: Payer: Self-pay

## 2024-03-14 DIAGNOSIS — Y92007 Garden or yard of unspecified non-institutional (private) residence as the place of occurrence of the external cause: Secondary | ICD-10-CM | POA: Diagnosis not present

## 2024-03-14 DIAGNOSIS — X501XXA Overexertion from prolonged static or awkward postures, initial encounter: Secondary | ICD-10-CM | POA: Diagnosis not present

## 2024-03-14 DIAGNOSIS — S39012A Strain of muscle, fascia and tendon of lower back, initial encounter: Secondary | ICD-10-CM | POA: Insufficient documentation

## 2024-03-14 DIAGNOSIS — S3992XA Unspecified injury of lower back, initial encounter: Secondary | ICD-10-CM | POA: Diagnosis present

## 2024-03-14 DIAGNOSIS — Y9389 Activity, other specified: Secondary | ICD-10-CM | POA: Insufficient documentation

## 2024-03-14 MED ORDER — TIZANIDINE HCL 4 MG PO CAPS: 4.0000 mg | ORAL_CAPSULE | Freq: Three times a day (TID) | ORAL | 0 refills | Status: AC | PRN

## 2024-03-14 MED ORDER — DICLOFENAC SODIUM 1 % EX GEL: 2.0000 g | Freq: Four times a day (QID) | CUTANEOUS | 0 refills | Status: AC

## 2024-03-14 MED ORDER — METHOCARBAMOL 500 MG PO TABS: 500.0000 mg | ORAL_TABLET | Freq: Three times a day (TID) | ORAL | 0 refills | Status: DC | PRN

## 2024-03-14 MED ORDER — KETOROLAC TROMETHAMINE 30 MG/ML IJ SOLN
30.0000 mg | Freq: Once | INTRAMUSCULAR | Status: AC
  Administered 2024-03-14: 30 mg via INTRAMUSCULAR
  Filled 2024-03-14: qty 1

## 2024-03-14 NOTE — Telephone Encounter (Signed)
 Medication needs to be sent by a different provider. (Bender,McLendon,Nooruddin,Zheng)

## 2024-03-14 NOTE — Discharge Instructions (Signed)
You have been seen in the Emergency Department (ED)  today for back pain.  Your workup and exam have not shown any acute abnormalities and you are likely suffering from muscle strain or possible problems with your discs, but there is no treatment that will fix your symptoms at this time.  Please take Motrin (ibuprofen) as needed for your pain according to the instructions written on the box.  Alternatively, for the next five days you can take 600mg  three times daily with meals (it may upset your stomach).  Please follow up with your doctor as soon as possible regarding today's ED visit and your back pain.  Return to the ED for worsening back pain, fever, weakness or numbness of either leg, or if you develop either (1) an inability to urinate or have bowel movements, or (2) loss of your ability to control your bathroom functions (if you start having "accidents"), or if you develop other new symptoms that concern you.

## 2024-03-14 NOTE — Telephone Encounter (Signed)
 Hey Dr.Zheng it looks like Dr.D'Mello is seeing the patient in the ED.

## 2024-03-14 NOTE — ED Provider Notes (Signed)
 [place] [facility] DEPART[facility] AT [facility] Provider Note   CSN: [id number] Arrival date & time: 03/14/24  9368     Patient presents with: Back Pain   Ronald Berg is a 48 y.o. male with no pertinent past medical history presents with a 3-4 month history of back pain that acute worsened yesterday. Patient reports that about 4 months ago, he was working in his yard and was carrying something when he made a sudden motion and thought he heard a pop in his back. He says that he usually takes meloxicam and methocarbamol  when he has flares once or twice a month and this usually helps lessen the pain that he is in. He also has usually been wearing a back brace that helps to reduce the number of flares he gets. He was not wearing it yesterday when he went to shovel something and felt sharp shooting pains in his lower back. Since then it has been hard for him to move. 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  He denies any numbness or tingling. At most the pain radiates to his right thigh. Patient denies any night sweats, fevers. He denies any history of IV drug use. He also denies any urinary or bowel bladder incontinence.    The history is provided by the patient. No language interpreter was used.  Back Pain      Prior to Admission medications   Medication Sig Start Date End Date Taking? Authorizing Provider  amoxicillin -clavulanate (AUGMENTIN ) 875-125 MG tablet Take 1 tablet by mouth 2 (two) times daily. 07/30/23   Colette Torrence GRADE, MD    Allergies: Soma [carisoprodol]    Review of Systems  Musculoskeletal:  Positive for back pain.  Negative unless otherwise noted in HPI   Updated Vital Signs BP 113/68   Pulse (!) 54   Temp 97.8 F (36.6 C) (Oral)   Resp 16   SpO2 100%   Physical Exam Cardiovascular:     Rate and Rhythm: Normal rate and regular rhythm.  Pulmonary:     Effort: Pulmonary effort  is normal.     Breath sounds: Normal breath sounds.  Musculoskeletal:     Comments: Negative straight leg raise  Tenderness to palpation in right lower paraspinal area.  No erythema or abrasions to the skin on the back.  No midline tenderness or step off deformity.   Neurological:     Mental Status: He is alert and oriented to person, place, and time.  Psychiatric:        Mood and Affect: Mood normal.     (all labs ordered are listed, but only abnormal results are displayed) Labs Reviewed - No data to display  EKG: None  Radiology: No results found.   Procedures   Medications Ordered in the ED  ketorolac  (TORADOL ) 30 MG/ML injection 30 mg (has no administration in time range)                                    Medical Decision Making Risk Prescription drug management.  Initial Assessment and Plan:  Differentials include muscle strain, degenerative disc disease, lumbar spondylosis, lumbar disc herniation, spinal stenosis, SI joint dysfunction, spinal fracture, compression spinal fracture, spinal epidural abscess, metastatic spinal malignancy. In the absence of red flag symptoms, believe that this is likely muscle strain that stems from initial  injury about 3-4 months ago. Seems like last time patient had PCP follow up was back in December to establish care. Does not seem that any lab work was done at that time. Will order X ray to look for any spinal fractures, although low likelihood and check for signs of any lytic lesions. Will also give toradol  x1 for pain control.   Final assessment & plan:  X ray did not show any acute changes, spinal fractures etc. Physical exam  is reassuring against red flag symptoms. This is likely a lumbar muscle sprain.  Will send patient home with voltaren  gel and tizanidine  as needed for 5 days. Ibuprofen as needed. Also recommended to try heating pads and lidocaine patches. Also given the information for [facility] and  educated patient on return precautions.     Final diagnoses:  None    ED Discharge Orders     None          D'Mello, Lamir Racca, DO 03/14/24 9090    Darra Fonda MATSU, MD 03/14/24 1020

## 2024-03-14 NOTE — ED Triage Notes (Signed)
 Pt injured his lower back x 3 months ago and has intermittent flare ups , with the latest occurring yesterday after working in his garden.

## 2024-03-14 NOTE — Telephone Encounter (Signed)
 Zanaflex  declined by Medicaid. Will send alternate medication.

## 2024-03-17 ENCOUNTER — Encounter: Payer: Self-pay | Admitting: Emergency Medicine

## 2024-03-17 ENCOUNTER — Ambulatory Visit: Admission: EM | Admit: 2024-03-17 | Discharge: 2024-03-17 | Disposition: A | Discharge status: Home / Self Care

## 2024-03-17 DIAGNOSIS — G8929 Other chronic pain: Secondary | ICD-10-CM

## 2024-03-17 DIAGNOSIS — M545 Low back pain, unspecified: Secondary | ICD-10-CM | POA: Diagnosis not present

## 2024-03-17 DIAGNOSIS — M62830 Muscle spasm of back: Secondary | ICD-10-CM

## 2024-03-17 NOTE — Discharge Instructions (Signed)
 Your pain is likely due to a muscle strain which will improve on its own with time.   - You may take over the counter medicines to help with pain (ibuprofen 600mg  every 6 hours as needed with food).  - You may also take the prescribed muscle relaxer from the ER (tizanidine ) as directed as needed for muscle aches/spasm.  Do not take this medication and drive or drink alcohol as it can make you sleepy.  Mainly use this medicine at nighttime as needed. - Apply heat 20 minutes on then 20 minutes off and perform gentle range of motion exercises to the area of greatest pain to prevent muscle stiffness and provide further pain relief.   Red flag symptoms to watch out for are numbness/tingling to the legs, weakness, loss of bowel/bladder control, and/or worsening pain that does not respond well to medicines. Follow-up with your primary care provider or return to urgent care if your symptoms do not improve in the next 3 to 4 days with medications and interventions recommended today. If your symptoms are severe (red flag), please go to the emergency room.

## 2024-03-17 NOTE — ED Provider Notes (Signed)
 [facility]    CSN: [id number] Arrival date & time: 03/17/24  0805      History   Chief Complaint Chief Complaint  Patient presents with   Back Pain    HPI Ronald Berg is a 48 y.o. male.   Ronald Berg is a 48 y.o. male presenting for chief complaint of bilateral lower back pain that started approximately 3 to 4 months ago and has worsened in the last 1 to 2 weeks.  Patient was gardening recently and believes this to have flared up his low back pain.  He was seen in the emergency department 3 days ago for lumbar pain where he was felt to have a muscle strain and was subsequently prescribed a muscle relaxer and anti-inflammatories to treat condition.  X-ray of the L-spine was performed and found to be unremarkable for acute bony abnormality but showed some mild bone spurring/disc space narrowing at L3-4 and L4-5.  Patient denies recent trauma or injuries to the low back.  Pain is triggered by certain movements and positions, improves significantly with rest and heat.  No previous surgeries to the spine. No fall, trauma, numbness or tingling, saddle paresthesia, changes to bowel or urinary habits, extremity weakness, radicular symptoms.  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 He was prescribed tizanidine  in the emergency room and has been taking this sparingly with some relief. He prefers to not take medications and states he would prefer a more holistic method of treatment with nonpharmacologic remedies. Wearing back brace for support.   Back Pain   Past Medical History:  Diagnosis Date   Alcoholism (HCC)    Allergy    seasonal.    There are no active problems to display for this patient.   Past Surgical History:  Procedure Laterality Date   NO PAST SURGERIES         Home Medications    Prior to Admission medications   Medication Sig Start Date End Date Taking?  Authorizing Provider  diclofenac  Sodium (VOLTAREN ) 1 % GEL Apply 2 g topically 4 (four) times daily. 03/14/24   D'Mello, Rosalyn, DO  methocarbamol  (ROBAXIN ) 500 MG tablet Take 1 tablet (500 mg total) by mouth every 8 (eight) hours as needed for muscle spasms. 03/14/24   Long, Fonda MATSU, MD  tiZANidine  (ZANAFLEX ) 4 MG capsule Take 1 capsule (4 mg total) by mouth 3 (three) times daily as needed for muscle spasms. 03/14/24   D'Mello, Rosalyn, DO    Family History Family History  Problem Relation Age of Onset   Melanoma Mother    Diverticulitis Paternal Grandmother    Parkinson's disease Paternal Grandfather    Alzheimer's disease Paternal Grandfather     Social History Social History   Tobacco Use   Smoking status: Some Days    Current packs/day: 1.00    Average packs/day: 1 pack/day for 22.0 years (22.0 ttl pk-yrs)    Types: Cigarettes    Passive exposure: Never   Smokeless tobacco: Never   Tobacco comments:    did counsel.  Vaping Use   Vaping status: Never Used  Substance Use Topics   Alcohol use: Not Currently    Comment: quit 11/12018   Drug use: No     Allergies   Soma [carisoprodol]   Review of Systems Review of Systems  Musculoskeletal:  Positive for back pain.  Per HPI   Physical Exam Triage Vital Signs ED  Triage Vitals  Encounter Vitals Group     BP 03/17/24 0830 135/77     Girls Systolic BP Percentile --      Girls Diastolic BP Percentile --      Boys Systolic BP Percentile --      Boys Diastolic BP Percentile --      Pulse Rate 03/17/24 0830 (!) 56     Resp 03/17/24 0830 17     Temp 03/17/24 0830 98.4 F (36.9 C)     Temp Source 03/17/24 0830 Oral     SpO2 03/17/24 0830 99 %     Weight --      Height --      Head Circumference --      Peak Flow --      Pain Score 03/17/24 0831 5     Pain Loc --      Pain Education --      Exclude from Growth Chart --    No data found.  Updated Vital Signs BP 135/77 (BP Location: Right Arm)   Pulse (!) 56    Temp 98.4 F (36.9 C) (Oral)   Resp 17   SpO2 99%   Visual Acuity Right Eye Distance:   Left Eye Distance:   Bilateral Distance:    Right Eye Near:   Left Eye Near:    Bilateral Near:     Physical Exam Vitals and nursing note reviewed.  Constitutional:      Appearance: He is not ill-appearing or toxic-appearing.  HENT:     Head: Normocephalic and atraumatic.     Right Ear: Hearing and external ear normal.     Left Ear: Hearing and external ear normal.     Nose: Nose normal.     Mouth/Throat:     Lips: Pink.  Eyes:     General: Lids are normal. Vision grossly intact. Gaze aligned appropriately.     Extraocular Movements: Extraocular movements intact.     Conjunctiva/sclera: Conjunctivae normal.  Pulmonary:     Effort: Pulmonary effort is normal.  Musculoskeletal:     Cervical back: Normal and neck supple.     Thoracic back: Normal.     Lumbar back: Tenderness (Tender to palpation over the bilateral lumbar paraspinous musculature) present. No swelling, edema, deformity, signs of trauma, lacerations, spasms or bony tenderness. Normal range of motion. Negative right straight leg raise test and negative left straight leg raise test. No scoliosis.       Back:     Comments: Strength and sensation intact to bilateral upper and lower extremities (5/5). Moves all 4 extremities with normal coordination voluntarily. Non-focal neuro exam.   Skin:    General: Skin is warm and dry.     Capillary Refill: Capillary refill takes less than 2 seconds.     Findings: No rash.  Neurological:     General: No focal deficit present.     Mental Status: He is alert and oriented to person, place, and time. Mental status is at baseline.     Cranial Nerves: No dysarthria or facial asymmetry.  Psychiatric:        Mood and Affect: Mood normal.        Speech: Speech normal.        Behavior: Behavior normal.        Thought Content: Thought content normal.        Judgment: Judgment normal.       UC Treatments / Results  Labs (all labs ordered  are listed, but only abnormal results are displayed) Labs Reviewed - No data to display  EKG   Radiology No results found.  Procedures Procedures (including critical care time)  Medications Ordered in UC Medications - No data to display  Initial Impression / Assessment and Plan / UC Course  I have reviewed the triage vital signs and the nursing notes.  Pertinent labs & imaging results that were available during my care of the patient were reviewed by me and considered in my medical decision making (see chart for details).   1.  Chronic bilateral low back pain without sciatica, spasm of lumbar paraspinous muscle Evaluation suggests pain is muscular in nature. Reviewed note from ER workup on March 14, 2024.  Recommend continued treatment with muscle relaxer, anti-inflammatories, gentle range of motion exercises, and heat as needed. Recommend follow-up with sports medicine and/or [facility]. Drowsiness precautions discussed regarding muscle.  Reviewed imaging from the ER showing stable findings. Work excuse note given.  Counseled patient on potential for adverse effects with medications prescribed/recommended today, strict ER and return-to-clinic precautions discussed, patient verbalized understanding.    Final Clinical Impressions(s) / UC Diagnoses   Final diagnoses:  Chronic bilateral low back pain without sciatica  Spasm of lumbar paraspinous muscle     Discharge Instructions      Your pain is likely due to a muscle strain which will improve on its own with time.   - You may take over the counter medicines to help with pain (ibuprofen 600mg  every 6 hours as needed with food).  - You may also take the prescribed muscle relaxer from the ER (tizanidine ) as directed as needed for muscle aches/spasm.  Do not take this medication and drive or drink alcohol as it can make you sleepy.   Mainly use this medicine at nighttime as needed. - Apply heat 20 minutes on then 20 minutes off and perform gentle range of motion exercises to the area of greatest pain to prevent muscle stiffness and provide further pain relief.   Red flag symptoms to watch out for are numbness/tingling to the legs, weakness, loss of bowel/bladder control, and/or worsening pain that does not respond well to medicines. Follow-up with your primary care provider or return to urgent care if your symptoms do not improve in the next 3 to 4 days with medications and interventions recommended today. If your symptoms are severe (red flag), please go to the emergency room.        ED Prescriptions   None    PDMP not reviewed this encounter.   Enedelia Going [place], [place] 03/17/24 [phone]

## 2024-03-17 NOTE — ED Triage Notes (Addendum)
 Pt c/o back pain for 3 months. He was seen in ER on 8/4 and given prescription for muscle relaxer Pt was gardening last week when his back pain was flared up.

## 2024-03-25 ENCOUNTER — Ambulatory Visit (INDEPENDENT_AMBULATORY_CARE_PROVIDER_SITE_OTHER): Admitting: Family Medicine

## 2024-03-25 ENCOUNTER — Encounter: Payer: Self-pay | Admitting: Family Medicine

## 2024-03-25 VITALS — BP 118/64 | HR 70 | Temp 98.7°F | Ht 72.0 in | Wt 161.0 lb

## 2024-03-25 DIAGNOSIS — M545 Low back pain, unspecified: Secondary | ICD-10-CM

## 2024-03-25 DIAGNOSIS — Z8659 Personal history of other mental and behavioral disorders: Secondary | ICD-10-CM | POA: Diagnosis not present

## 2024-03-25 DIAGNOSIS — Z7689 Persons encountering health services in other specified circumstances: Secondary | ICD-10-CM

## 2024-03-25 DIAGNOSIS — Z87898 Personal history of other specified conditions: Secondary | ICD-10-CM | POA: Diagnosis not present

## 2024-03-25 NOTE — Patient Instructions (Addendum)
 A referral was placed to sports medicine.  They will contact you regarding setting up this appt and any further recommendations.

## 2024-03-25 NOTE — Progress Notes (Signed)
 Established Patient Office Visit   Subjective  Patient ID: Ronald Berg, male    DOB: [birth year]  Age: 48 y.o. MRN: [id number]  Chief Complaint  Patient presents with   New Patient (Initial Visit)    Lower right side back pain, started 4 months ago from an injury, patient rates the pain a 4 out of 10     Pt is a 48 yo male seen to est care.  Per pt he has not had a regular doctor for numerous years.  Back pain: Initial injury four months ago,tripped backwards while holding a log, resulting in a twisting motion that caused immediate severe pain. He did not seek medical attention.  Since endorses episodic low back pain with the most recent starting 2 weeks ago.  Seen in ED on 8/4 and UC on 8/7 for the back pain.  X-ray done in ED on 8/4.  Patient states he is taking both Robaxin  and tizanidine , and an old left over prescription for prednisone.  Feels like his upper body is 'about to just snap off' his lower body.  Insomnia:  waking up around 3 AM due to anxiety and struggling to return to sleep. This occurs about three times a week. Occasionally, he takes hydroxyzine, which is prescribed to his daughter, to help with sleep.  H/o depression:  not on meds.   He previously found counseling beneficial but has not pursued it recently.  Allergies:  Soma- sweating, lightheadedness, and feeling hot.  He has a history of smoking, having quit over a year ago after smoking since age 61. He also has a history of alcohol use but has been sober for nearly ten years. He occasionally uses marijuana. He works for [employer].   He reports a family history of prostate cancer in his father, as well as alcoholism (dad, mom) and depression (sis, dad) in his family.    There are no active problems to display for this patient.  Past Medical History:  Diagnosis Date   Alcoholism (HCC)    Allergy    seasonal.   Past Surgical History:  Procedure Laterality Date   NO PAST SURGERIES     Social  History   Tobacco Use   Smoking status: Some Days    Current packs/day: 1.00    Average packs/day: 1 pack/day for 22.0 years (22.0 ttl pk-yrs)    Types: Cigarettes    Passive exposure: Never   Smokeless tobacco: Never   Tobacco comments:    did counsel.  Vaping Use   Vaping status: Never Used  Substance Use Topics   Alcohol use: Not Currently    Comment: quit 11/12018   Drug use: No   Family History  Problem Relation Age of Onset   Melanoma Mother    Diverticulitis Paternal Grandmother    Parkinson's disease Paternal Grandfather    Alzheimer's disease Paternal Grandfather    Allergies  Allergen Reactions   Soma [Carisoprodol]     ROS Negative unless stated above    Objective:     BP 118/64 (BP Location: Left Arm, Patient Position: Sitting, Cuff Size: Normal)   Pulse 70   Temp 98.7 F (37.1 C) (Oral)   Ht 6' (1.829 m)   Wt 161 lb (73 kg)   SpO2 98%   BMI 21.84 kg/m  BP Readings from Last 3 Encounters:  03/25/24 118/64  03/17/24 135/77  03/14/24 113/68   Wt Readings from Last 3 Encounters:  03/25/24 161 lb (73 kg)  07/30/23 154 lb 3.2 oz (69.9 kg)  12/07/17 139 lb (63 kg)      Physical Exam Constitutional:      General: He is not in acute distress.    Appearance: Normal appearance.  HENT:     Head: Normocephalic and atraumatic.     Nose: Nose normal.     Mouth/Throat:     Mouth: Mucous membranes are moist.  Cardiovascular:     Rate and Rhythm: Normal rate and regular rhythm.     Heart sounds: Normal heart sounds. No murmur heard.    No gallop.  Pulmonary:     Effort: Pulmonary effort is normal. No respiratory distress.     Breath sounds: Normal breath sounds. No wheezing, rhonchi or rales.  Musculoskeletal:     Cervical back: Normal.     Thoracic back: Normal.     Comments: TTP of lumbosacral area mid midline and b/l.  Skin:    General: Skin is warm and dry.  Neurological:     Mental Status: He is alert and oriented to person, place, and  time.     Cranial Nerves: Cranial nerves 2-12 are intact.        07/30/2023    3:20 PM 03/18/2017    8:34 AM  Depression screen PHQ 2/9  Decreased Interest 0 1  Down, Depressed, Hopeless 0 0  PHQ - 2 Score 0 1  Altered sleeping 2   Tired, decreased energy 0   Change in appetite 0   Feeling bad or failure about yourself  0   Trouble concentrating 0   Moving slowly or fidgety/restless 0   Suicidal thoughts 0   PHQ-9 Score 2   Difficult doing work/chores Not difficult at all       07/30/2023    3:21 PM  GAD 7 : Generalized Anxiety Score  Nervous, Anxious, on Edge 0  Control/stop worrying 0  Worry too much - different things 0  Trouble relaxing 0  Restless 0  Easily annoyed or irritable 0  Afraid - awful might happen 0  Total GAD 7 Score 0  Anxiety Difficulty Not difficult at all     No results found for any visits on 03/25/24.    Assessment & Plan:   Acute bilateral low back pain without sciatica -     Ambulatory referral to Sports Medicine  History of depression  History of insomnia  Encounter to establish care  [facility] back pain acute on chronic?  Imaging from ED 8/4 reviewed and negative.  Advised on likely duration of symptoms.  Continue muscle relaxer, picked up both rxs from ED advised not to take both.  Supportive care. Discussed likely duration of sx.  Sports med referral given.  Sleep hygiene.  Advised against use of other ppls meds.  Return if symptoms worsen or fail to improve.   Clotilda JONELLE Single, MD

## 2024-04-12 NOTE — Progress Notes (Unsigned)
 [facility] [address] [place] [address] Phone: [phone]   Assessment and Plan:     1. Chronic bilateral low back pain without sciatica (Primary) -Chronic with exacerbation, initial visit - Most consistent with lumbar muscular strain originally occurring 4 months ago with intermittent flares since that time - Reviewed lumbar x-ray with patient in clinic.  My interpretation: No acute fracture or vertebral collapse - Reassuring that patient's flare has resolved with prednisone Dosepak 2 weeks ago -To further eliminate ongoing inflammation, start meloxicam  15 mg daily x2 weeks.  If still having pain after 2 weeks, complete 3rd-week of NSAID. May use remaining NSAID as needed once daily for pain control.  Do not to use additional over-the-counter NSAIDs (ibuprofen, naproxen, Advil, Aleve, etc.) while taking prescription NSAIDs.  May use Tylenol [phone] mg 2 to 3 times a day for breakthrough pain.  -Start HEP and physical therapy for low back and core to improve strength and stability and prevent recurrence of symptoms  15 additional minutes spent for educating Therapeutic Home Exercise Program.  This included exercises focusing on stretching, strengthening, with focus on eccentric aspects.   Long term goals include an improvement in range of motion, strength, endurance as well as avoiding reinjury. Patient's frequency would include in 1-2 times a day, 3-5 times a week for a duration of 6-12 weeks. Proper technique shown and discussed handout in great detail with ATC.  All questions were discussed and answered.      Pertinent previous records reviewed include ER note 8//25, urgent care note 03/17/2024, family medicine note 03/25/2024, Lumbar x-ray 03/14/24  Follow Up: 4 weeks for reevaluation.  Could discuss OMT   Subjective:   I, Ronald Berg, am serving as a Neurosurgeon for Doctor Morene Mace  Chief Complaint: low back pain    HPI:   04/13/2024 Patient is a 48 year old male with low back pain. Patient states 4 months ago he flared his back while doing some yard work. He has had intermittent flares of pain since then. Decreased ROM. Has been doing RICES and he feels okay today. Ibu and tylenol and that helps a little. Robaxin  helped when flare was acute. Old rx of prednisone has helped the most. Took that a week ago. Has been using a back brace and boots that help him stay straight up. States when he was picking things up he sometimes has the urge to urinate    Relevant Historical Information: None pertinent  Additional pertinent review of systems negative.   Current Outpatient Medications:    meloxicam  (MOBIC ) 15 MG tablet, Take 1 tablet (15 mg total) by mouth daily., Disp: 30 tablet, Rfl: 0   diclofenac  Sodium (VOLTAREN ) 1 % GEL, Apply 2 g topically 4 (four) times daily., Disp: 100 g, Rfl: 0   tiZANidine  (ZANAFLEX ) 4 MG capsule, Take 1 capsule (4 mg total) by mouth 3 (three) times daily as needed for muscle spasms., Disp: 20 capsule, Rfl: 0   Objective:     Vitals:   04/13/24 0810  Pulse: 74  SpO2: 98%  Weight: 167 lb (75.8 kg)  Height: 6' (1.829 m)      Body mass index is 22.65 kg/m.    Physical Exam:    Gen: Appears well, nad, nontoxic and pleasant Psych: Alert and oriented, appropriate mood and affect Neuro: sensation intact, strength is 5/5 in upper and lower extremities, muscle tone wnl Skin: no susupicious lesions or rashes  Back -  Normal skin, Spine with normal alignment and no deformity.   Mild tenderness to lumbar  vertebral process palpation.   Right lower lumbar paraspinous muscles are mildly tender and without spasm NTTP gluteal musculature Straight leg raise negative Trendelenberg negative Piriformis Test negative Gait normal  Low back tension, worse right-sided with lumbar extension and flexion  Electronically signed by:  Odis Mace [facility] 8:37  AM 04/13/24

## 2024-04-13 ENCOUNTER — Ambulatory Visit (INDEPENDENT_AMBULATORY_CARE_PROVIDER_SITE_OTHER): Admitting: Sports Medicine

## 2024-04-13 VITALS — HR 74 | Ht 72.0 in | Wt 167.0 lb

## 2024-04-13 DIAGNOSIS — G8929 Other chronic pain: Secondary | ICD-10-CM | POA: Diagnosis not present

## 2024-04-13 DIAGNOSIS — M545 Low back pain, unspecified: Secondary | ICD-10-CM

## 2024-04-13 MED ORDER — MELOXICAM 15 MG PO TABS: 15.0000 mg | ORAL_TABLET | Freq: Every day | ORAL | 0 refills | Status: AC

## 2024-04-13 NOTE — Patient Instructions (Signed)
-   Start meloxicam  15 mg daily x2 weeks.  If still having pain after 2 weeks, complete 3rd-week of NSAID. May use remaining NSAID as needed once daily for pain control.  Do not to use additional over-the-counter NSAIDs (ibuprofen, naproxen, Advil, Aleve, etc.) while taking prescription NSAIDs.  May use Tylenol [phone] mg 2 to 3 times a day for breakthrough pain.  Low back HEP  PT referral    Work note provided   4 week follow up

## 2024-05-10 NOTE — Progress Notes (Unsigned)
    [facility] [address] [place] [address] Phone: [phone]   Assessment and Plan:     1. Chronic bilateral low back pain without sciatica (Primary) -Chronic with exacerbation, subsequent visit - Overall significant improvement in lumbar muscular strain, originally occurring 5+ months ago and improving with prednisone course, muscle relaxer course, HEP - Patient was improving, so did not start meloxicam  course.  Use meloxicam  15 mg daily as needed for breakthrough pain.  Recommend limiting chronic NSAIDs to 1-2 doses per week to prevent long-term side effects. Use Tylenol 500 to 1000 mg tablets 2-3 times a day as needed for day-to-day pain relief.    - May use tizanidine  nightly as needed for muscle spasms - Continue HEP as tolerated -Based on improvement, I do not feel that patient needs to start physical therapy or OMT at this time  Pertinent previous records reviewed include none   Follow Up: As needed.  Could consider physical therapy, OMT, NSAIDs/prednisone course   Subjective:   I, Ronald Berg, am serving as a Neurosurgeon for Doctor Morene Mace   Chief Complaint: low back pain    HPI:    04/13/2024 Patient is a 48 year old male with low back pain. Patient states 4 months ago he flared his back while doing some yard work. He has had intermittent flares of pain since then. Decreased ROM. Has been doing RICES and he feels okay today. Ibu and tylenol and that helps a little. Robaxin  helped when flare was acute. Old rx of prednisone has helped the most. Took that a week ago. Has been using a back brace and boots that help him stay straight up. States when he was picking things up he sometimes has the urge to urinate    05/11/2024 Patient states he is great    Relevant Historical Information: None pertinent    Additional pertinent review of systems negative.   Current Outpatient Medications:    diclofenac  Sodium  (VOLTAREN ) 1 % GEL, Apply 2 g topically 4 (four) times daily., Disp: 100 g, Rfl: 0   meloxicam  (MOBIC ) 15 MG tablet, Take 1 tablet (15 mg total) by mouth daily., Disp: 30 tablet, Rfl: 0   tiZANidine  (ZANAFLEX ) 4 MG capsule, Take 1 capsule (4 mg total) by mouth 3 (three) times daily as needed for muscle spasms., Disp: 20 capsule, Rfl: 0   Objective:     Vitals:   05/11/24 0809  BP: 118/82  Pulse: 78  SpO2: 98%  Weight: 156 lb (70.8 kg)  Height: 6' (1.829 m)      Body mass index is 21.16 kg/m.    Physical Exam:    Gen: Appears well, nad, nontoxic and pleasant Psych: Alert and oriented, appropriate mood and affect Neuro: sensation intact, strength is 5/5 in upper and lower extremities, muscle tone wnl Skin: no susupicious lesions or rashes  Back - Normal skin, Spine with normal alignment and no deformity.   No tenderness to vertebral process palpation.   Paraspinous muscles are not tender and without spasm NTTP gluteal musculature Straight leg raise negative Trendelenberg negative Piriformis Test negative Gait normal    Electronically signed by:  [facility] 8:21 AM 05/11/24

## 2024-05-11 ENCOUNTER — Ambulatory Visit (INDEPENDENT_AMBULATORY_CARE_PROVIDER_SITE_OTHER): Admitting: Sports Medicine

## 2024-05-11 VITALS — BP 118/82 | HR 78 | Ht 72.0 in | Wt 156.0 lb

## 2024-05-11 DIAGNOSIS — G8929 Other chronic pain: Secondary | ICD-10-CM

## 2024-05-11 DIAGNOSIS — M545 Low back pain, unspecified: Secondary | ICD-10-CM | POA: Diagnosis not present

## 2024-05-11 NOTE — Patient Instructions (Signed)
-   Use meloxicam  15 mg daily as needed for breakthrough pain.  Recommend limiting chronic NSAIDs to 1-2 doses per week to prevent long-term side effects. Use Tylenol 500 to 1000 mg tablets 2-3 times a day as needed for day-to-day pain relief.    Tizanidine  at night for muscles spasms. Will make you drowsy  Continue HEP    As needed follow up
